# Patient Record
Sex: Male | Born: 1970 | Race: White | Hispanic: No | Smoking: Current every day smoker
Health system: Southern US, Community
[De-identification: ages and names within clinical notes are randomized; demographics above are authoritative.]

## PROBLEM LIST (undated history)

## (undated) DIAGNOSIS — F209 Schizophrenia, unspecified: Secondary | ICD-10-CM

## (undated) DIAGNOSIS — F329 Major depressive disorder, single episode, unspecified: Secondary | ICD-10-CM

## (undated) DIAGNOSIS — L509 Urticaria, unspecified: Secondary | ICD-10-CM

## (undated) DIAGNOSIS — F319 Bipolar disorder, unspecified: Secondary | ICD-10-CM

## (undated) DIAGNOSIS — F419 Anxiety disorder, unspecified: Secondary | ICD-10-CM

## (undated) DIAGNOSIS — F32A Depression, unspecified: Secondary | ICD-10-CM

## (undated) DIAGNOSIS — K219 Gastro-esophageal reflux disease without esophagitis: Secondary | ICD-10-CM

## (undated) DIAGNOSIS — G43909 Migraine, unspecified, not intractable, without status migrainosus: Secondary | ICD-10-CM

## (undated) DIAGNOSIS — R011 Cardiac murmur, unspecified: Secondary | ICD-10-CM

---

## 1999-04-21 ENCOUNTER — Emergency Department (HOSPITAL_COMMUNITY): Admission: EM | Admit: 1999-04-21 | Discharge: 1999-04-21 | Payer: Self-pay | Admitting: Emergency Medicine

## 2000-03-16 ENCOUNTER — Encounter: Payer: Self-pay | Admitting: Emergency Medicine

## 2000-03-16 ENCOUNTER — Emergency Department (HOSPITAL_COMMUNITY): Admission: EM | Admit: 2000-03-16 | Discharge: 2000-03-16 | Payer: Self-pay | Admitting: Emergency Medicine

## 2001-08-17 ENCOUNTER — Emergency Department (HOSPITAL_COMMUNITY): Admission: EM | Admit: 2001-08-17 | Discharge: 2001-08-17 | Payer: Self-pay | Admitting: Emergency Medicine

## 2002-08-07 ENCOUNTER — Emergency Department (HOSPITAL_COMMUNITY): Admission: EM | Admit: 2002-08-07 | Discharge: 2002-08-07 | Payer: Self-pay | Admitting: Emergency Medicine

## 2002-08-07 ENCOUNTER — Encounter: Payer: Self-pay | Admitting: Emergency Medicine

## 2003-08-06 ENCOUNTER — Ambulatory Visit (HOSPITAL_COMMUNITY): Admission: RE | Admit: 2003-08-06 | Discharge: 2003-08-06 | Payer: Self-pay | Admitting: Orthopedic Surgery

## 2003-08-06 ENCOUNTER — Ambulatory Visit (HOSPITAL_BASED_OUTPATIENT_CLINIC_OR_DEPARTMENT_OTHER): Admission: RE | Admit: 2003-08-06 | Discharge: 2003-08-06 | Payer: Self-pay | Admitting: Orthopedic Surgery

## 2003-09-13 ENCOUNTER — Emergency Department (HOSPITAL_COMMUNITY): Admission: EM | Admit: 2003-09-13 | Discharge: 2003-09-13 | Payer: Self-pay | Admitting: Emergency Medicine

## 2004-12-02 ENCOUNTER — Emergency Department (HOSPITAL_COMMUNITY): Admission: EM | Admit: 2004-12-02 | Discharge: 2004-12-02 | Payer: Self-pay | Admitting: Emergency Medicine

## 2014-05-28 ENCOUNTER — Encounter (HOSPITAL_COMMUNITY): Payer: Self-pay | Admitting: Emergency Medicine

## 2014-05-28 ENCOUNTER — Emergency Department (HOSPITAL_COMMUNITY)
Admission: EM | Admit: 2014-05-28 | Discharge: 2014-05-28 | Disposition: A | Discharge status: Home / Self Care | Payer: Self-pay | Attending: Emergency Medicine | Admitting: Emergency Medicine

## 2014-05-28 ENCOUNTER — Emergency Department (HOSPITAL_COMMUNITY): Payer: Self-pay

## 2014-05-28 DIAGNOSIS — W010XXA Fall on same level from slipping, tripping and stumbling without subsequent striking against object, initial encounter: Secondary | ICD-10-CM | POA: Insufficient documentation

## 2014-05-28 DIAGNOSIS — R0781 Pleurodynia: Secondary | ICD-10-CM

## 2014-05-28 DIAGNOSIS — S46909A Unspecified injury of unspecified muscle, fascia and tendon at shoulder and upper arm level, unspecified arm, initial encounter: Secondary | ICD-10-CM | POA: Insufficient documentation

## 2014-05-28 DIAGNOSIS — S298XXA Other specified injuries of thorax, initial encounter: Secondary | ICD-10-CM | POA: Insufficient documentation

## 2014-05-28 DIAGNOSIS — Y9389 Activity, other specified: Secondary | ICD-10-CM | POA: Insufficient documentation

## 2014-05-28 DIAGNOSIS — S4980XA Other specified injuries of shoulder and upper arm, unspecified arm, initial encounter: Secondary | ICD-10-CM | POA: Insufficient documentation

## 2014-05-28 DIAGNOSIS — Y92009 Unspecified place in unspecified non-institutional (private) residence as the place of occurrence of the external cause: Secondary | ICD-10-CM | POA: Insufficient documentation

## 2014-05-28 DIAGNOSIS — M25511 Pain in right shoulder: Secondary | ICD-10-CM

## 2014-05-28 DIAGNOSIS — F17200 Nicotine dependence, unspecified, uncomplicated: Secondary | ICD-10-CM | POA: Insufficient documentation

## 2014-05-28 MED ORDER — IBUPROFEN 800 MG PO TABS
800.0000 mg | ORAL_TABLET | Freq: Once | ORAL | Status: AC
  Administered 2014-05-28: 800 mg via ORAL
  Filled 2014-05-28: qty 1

## 2014-05-28 MED ORDER — HYDROCODONE-ACETAMINOPHEN 5-325 MG PO TABS: 1.0000 | ORAL_TABLET | ORAL | Status: DC | PRN

## 2014-05-28 MED ORDER — IBUPROFEN 800 MG PO TABS: 800.0000 mg | ORAL_TABLET | Freq: Three times a day (TID) | ORAL | Status: DC | PRN

## 2014-05-28 NOTE — ED Provider Notes (Signed)
TIME SEEN: 4:55 PM  CHIEF COMPLAINT:  Right rib pain and shoulder pain  HPI: Patient is a 43 year old right-hand-dominant male with no significant past medical history who reports she tripped on uneven floor in his house last night when getting up to the bathroom and fell into a [place] cabinet. He states that while at work today he noticed that his right shoulder and right rib cage was bothering him. He did not hit his head or pass out. Denies any numbness, tingling or focal weakness. No shortness of breath. No abdominal pain. He is not on anticoagulation. States he drove himself to the emergency department.  ROS: See HPI Constitutional: no fever  Eyes: no drainage  ENT: no runny nose   Cardiovascular:  no chest pain  Resp: no SOB  GI: no vomiting GU: no dysuria Integumentary: no rash  Allergy: no hives  Musculoskeletal: no leg swelling  Neurological: no slurred speech ROS otherwise negative  PAST MEDICAL HISTORY/PAST SURGICAL HISTORY:  History reviewed. No pertinent past medical history.  MEDICATIONS:  Prior to Admission medications   Not on File    ALLERGIES:  Allergies  Allergen Reactions  . Bee Venom     SOCIAL HISTORY:  History  Substance Use Topics  . Smoking status: Light Tobacco Smoker  . Smokeless tobacco: Not on file  . Alcohol Use: No    FAMILY HISTORY: History reviewed. No pertinent family history.  EXAM: BP 119/73  Pulse 77  Temp(Src) 99.3 F (37.4 C) (Oral)  Resp 18  Ht 5\' 9"  (1.753 m)  Wt 156 lb (70.761 kg)  BMI 23.03 kg/m2  SpO2 99% CONSTITUTIONAL: Alert and oriented and responds appropriately to questions. Well-appearing; well-nourished; GCS 15 HEAD: Normocephalic; atraumatic EYES: Conjunctivae clear, PERRL, EOMI ENT: normal nose; no rhinorrhea; moist mucous membranes; pharynx without lesions noted; no dental injury; no septal hematoma NECK: Supple, no meningismus, no LAD; no midline spinal tenderness, step-off or deformity CARD: RRR; S1 and  S2 appreciated; no murmurs, no clicks, no rubs, no gallops RESP: Normal chest excursion without splinting or tachypnea; breath sounds clear and equal bilaterally; no wheezes, no rhonchi, no rales; chest wall stable, tender to palpation over the right ribs with no obvious bony deformity or crepitus or ecchymosis ABD/GI: Normal bowel sounds; non-distended; soft, non-tender, no rebound, no guarding PELVIS:  stable, nontender to palpation BACK:  The back appears normal and is non-tender to palpation, there is no CVA tenderness; no midline spinal tenderness, step-off or deformity EXT: Tender to palpation over the right shoulder but full range of motion in all joints of the right arm, 2+ radial pulses bilaterally, sensation to light touch intact diffusely, there is a small bruise to the distal ulnar/volar aspect of the right forearm with no bony tenderness or deformity, Normal ROM in all joints; otherwise extremities are non-tender to palpation; no edema; normal capillary refill; no cyanosis; compartments soft SKIN: Normal color for age and race; warm NEURO: Moves all extremities equally, sensation to light touch intact diffusely PSYCH: The patient's mood and manner are appropriate. Grooming and personal hygiene are appropriate.  MEDICAL DECISION MAKING: Patient here with mechanical fall. We'll obtain x-rays of his right shoulder and right ribs. Will give ibuprofen for pain. Anticipate discharge home.  ED PROGRESS: X-ray show no acute fracture. Likely just contusions. We'll discharge him with supportive care instructions and return precautions, prescription for Vicodin as needed and ibuprofen. Patient verbalizes understanding and is comfortable with plan.     Layla MawKristen N Kushi Kun, DO 05/28/14  1804 

## 2014-05-28 NOTE — Discharge Instructions (Signed)
RICE: Routine Care for Injuries  The routine care of many injuries includes Rest, Ice, Compression, and Elevation (RICE).  HOME CARE INSTRUCTIONS  · Rest is needed to allow your body to heal. Routine activities can usually be resumed when comfortable. Injured tendons and bones can take up to 6 weeks to heal. Tendons are the cord-like structures that attach muscle to bone.  · Ice following an injury helps keep the swelling down and reduces pain.  ¨ Put ice in a plastic bag.  ¨ Place a towel between your skin and the bag.  ¨ Leave the ice on for 15-20 minutes, 3-4 times a day, or as directed by your health care provider. Do this while awake, for the first 24 to 48 hours. After that, continue as directed by your caregiver.  · Compression helps keep swelling down. It also gives support and helps with discomfort. If an elastic bandage has been applied, it should be removed and reapplied every 3 to 4 hours. It should not be applied tightly, but firmly enough to keep swelling down. Watch fingers or toes for swelling, bluish discoloration, coldness, numbness, or excessive pain. If any of these problems occur, remove the bandage and reapply loosely. Contact your caregiver if these problems continue.  · Elevation helps reduce swelling and decreases pain. With extremities, such as the arms, hands, legs, and feet, the injured area should be placed near or above the level of the heart, if possible.  SEEK IMMEDIATE MEDICAL CARE IF:  · You have persistent pain and swelling.  · You develop redness, numbness, or unexpected weakness.  · Your symptoms are getting worse rather than improving after several days.  These symptoms may indicate that further evaluation or further X-rays are needed. Sometimes, X-rays may not show a small broken bone (fracture) until 1 week or 10 days later. Make a follow-up appointment with your caregiver. Ask when your X-ray results will be ready. Make sure you get your X-ray results.  Document Released:  02/12/2001 Document Revised: 11/05/2013 Document Reviewed: 04/01/2011  ExitCare® Patient Information ©2015 [employer]. This information is not intended to replace advice given to you by your health care provider. Make sure you discuss any questions you have with your health care provider.  Contusion  A contusion is a deep bruise. Contusions are the result of an injury that caused bleeding under the skin. The contusion may turn blue, purple, or yellow. Minor injuries will give you a painless contusion, but more severe contusions may stay painful and swollen for a few weeks.   CAUSES   A contusion is usually caused by a blow, trauma, or direct force to an area of the body.  SYMPTOMS   · Swelling and redness of the injured area.  · Bruising of the injured area.  · Tenderness and soreness of the injured area.  · Pain.  DIAGNOSIS   The diagnosis can be made by taking a history and physical exam. An X-ray, CT scan, or MRI may be needed to determine if there were any associated injuries, such as fractures.  TREATMENT   Specific treatment will depend on what area of the body was injured. In general, the best treatment for a contusion is resting, icing, elevating, and applying cold compresses to the injured area. Over-the-counter medicines may also be recommended for pain control. Ask your caregiver what the best treatment is for your contusion.  HOME CARE INSTRUCTIONS   · Put ice on the injured area.  · Put ice in   a plastic bag.  · Place a towel between your skin and the bag.  · Leave the ice on for 15-20 minutes, 3-4 times a day, or as directed by your health care provider.  · Only take over-the-counter or prescription medicines for pain, discomfort, or fever as directed by your caregiver. Your caregiver may recommend avoiding anti-inflammatory medicines (aspirin, ibuprofen, and naproxen) for 48 hours because these medicines may increase bruising.  · Rest the injured area.  · If possible, elevate the injured area to  reduce swelling.  SEEK IMMEDIATE MEDICAL CARE IF:   · You have increased bruising or swelling.  · You have pain that is getting worse.  · Your swelling or pain is not relieved with medicines.  MAKE SURE YOU:   · Understand these instructions.  · Will watch your condition.  · Will get help right away if you are not doing well or get worse.  Document Released: 08/10/2005 Document Revised: 11/05/2013 Document Reviewed: 09/05/2011  ExitCare® Patient Information ©2015 [employer]. This information is not intended to replace advice given to you by your health care provider. Make sure you discuss any questions you have with your health care provider.

## 2014-05-28 NOTE — ED Notes (Addendum)
Patient states he tripped due to uneven floor today. Right arm/rib pain. Worsen with movement.

## 2014-11-10 ENCOUNTER — Emergency Department (HOSPITAL_COMMUNITY): Payer: Medicaid Other

## 2014-11-10 ENCOUNTER — Emergency Department (HOSPITAL_COMMUNITY)
Admission: EM | Admit: 2014-11-10 | Discharge: 2014-11-10 | Disposition: A | Discharge status: Home / Self Care | Payer: Medicaid Other | Source: Home / Self Care | Attending: Family Medicine | Admitting: Family Medicine

## 2014-11-10 ENCOUNTER — Encounter (HOSPITAL_COMMUNITY): Payer: Self-pay | Admitting: Emergency Medicine

## 2014-11-10 ENCOUNTER — Encounter (HOSPITAL_COMMUNITY): Payer: Self-pay | Admitting: Adult Health

## 2014-11-10 ENCOUNTER — Emergency Department (HOSPITAL_COMMUNITY)
Admission: EM | Admit: 2014-11-10 | Discharge: 2014-11-11 | Disposition: A | Discharge status: Home / Self Care | Payer: Medicaid Other | Attending: Emergency Medicine | Admitting: Emergency Medicine

## 2014-11-10 DIAGNOSIS — R0602 Shortness of breath: Secondary | ICD-10-CM | POA: Diagnosis present

## 2014-11-10 DIAGNOSIS — Z720 Tobacco use: Secondary | ICD-10-CM | POA: Insufficient documentation

## 2014-11-10 DIAGNOSIS — J111 Influenza due to unidentified influenza virus with other respiratory manifestations: Secondary | ICD-10-CM | POA: Diagnosis not present

## 2014-11-10 DIAGNOSIS — R000 Tachycardia, unspecified: Secondary | ICD-10-CM | POA: Insufficient documentation

## 2014-11-10 DIAGNOSIS — J189 Pneumonia, unspecified organism: Secondary | ICD-10-CM

## 2014-11-10 LAB — BASIC METABOLIC PANEL
Anion gap: 14 (ref 5–15)
BUN: 12 mg/dL (ref 6–23)
CALCIUM: 9.7 mg/dL (ref 8.4–10.5)
CO2: 20 mmol/L (ref 19–32)
Chloride: 103 mEq/L (ref 96–112)
Creatinine, Ser: 0.88 mg/dL (ref 0.50–1.35)
GFR calc Af Amer: >90 mL/min (ref >90)
Glucose, Bld: 105 mg/dL — ABNORMAL HIGH (ref 70–99)
POTASSIUM: 3.6 mmol/L (ref 3.5–5.1)
SODIUM: 137 mmol/L (ref 135–145)

## 2014-11-10 LAB — CBC
HCT: 42.1 % (ref 39.0–52.0)
Hemoglobin: 14 g/dL (ref 13.0–17.0)
MCH: 31.1 pg (ref 26.0–34.0)
MCHC: 33.3 g/dL (ref 30.0–36.0)
MCV: 93.6 fL (ref 78.0–100.0)
PLATELETS: 216 10*3/uL (ref 150–400)
RBC: 4.5 MIL/uL (ref 4.22–5.81)
RDW: 13 % (ref 11.5–15.5)
WBC: 7.5 10*3/uL (ref 4.0–10.5)

## 2014-11-10 LAB — I-STAT CG4 LACTIC ACID, ED: LACTIC ACID, VENOUS: 2.28 mmol/L — AB (ref 0.5–2.2)

## 2014-11-10 MED ORDER — SODIUM CHLORIDE 0.9 % IV BOLUS (SEPSIS)
1000.0000 mL | Freq: Once | INTRAVENOUS | Status: AC
  Administered 2014-11-11: 1000 mL via INTRAVENOUS

## 2014-11-10 MED ORDER — ACETAMINOPHEN 325 MG PO TABS
650.0000 mg | ORAL_TABLET | Freq: Once | ORAL | Status: AC
  Administered 2014-11-11: 650 mg via ORAL
  Filled 2014-11-10: qty 2

## 2014-11-10 NOTE — ED Notes (Signed)
[facility] be discharged [facility] [place] [facility] [facility]

## 2014-11-10 NOTE — ED Provider Notes (Signed)
CSN: [id number]     Arrival date & time 11/10/14  1915 History   First MD Initiated Contact with Patient 11/10/14 1929     Chief Complaint  Patient presents with  . Cough  . Fever  . Generalized Body Aches   (Consider location/radiation/quality/duration/timing/severity/associated sxs/prior Treatment) Patient is a 43 y.o. male presenting with cough. The history is provided by the patient.  Cough Cough characteristics:  Productive Sputum characteristics:  Green Severity:  Moderate Onset quality:  Gradual Progression:  Worsening Chronicity:  New Smoker: yes   Context: upper respiratory infection   Relieved by:  None tried Worsened by:  Nothing tried Ineffective treatments:  None tried Associated symptoms: shortness of breath   Associated symptoms: no wheezing     History reviewed. No pertinent past medical history. History reviewed. No pertinent past surgical history. History reviewed. No pertinent family history. History  Substance Use Topics  . Smoking status: Current Every Day Smoker -- 0.50 packs/day    Types: Cigarettes  . Smokeless tobacco: Not on file  . Alcohol Use: No    Review of Systems  Constitutional: Negative.   HENT: Negative.   Respiratory: Positive for cough and shortness of breath. Negative for wheezing.   Cardiovascular: Negative.     Allergies  Bee venom  Home Medications   Prior to Admission medications   Medication Sig Start Date End Date Taking? Authorizing Provider  Aspirin-Caffeine (BC FAST PAIN RELIEF PO) Take 1 packet by mouth daily as needed. pain    Historical Provider, MD  HYDROcodone-acetaminophen (NORCO/VICODIN) 5-325 MG per tablet Take 1 tablet by mouth every 4 (four) hours as needed. 05/28/14   Kristen N Ward, DO  ibuprofen (ADVIL,MOTRIN) 800 MG tablet Take 1 tablet (800 mg total) by mouth every 8 (eight) hours as needed for mild pain. 05/28/14   Kristen N Ward, DO   BP 115/85 mmHg  Pulse 120  Temp(Src) 97.4 F (36.3 C) (Oral)   Resp 22  SpO2 95% Physical Exam  Constitutional: He is oriented to person, place, and time. He appears cachectic. He is cooperative. He has a sickly appearance. He appears ill.  HENT:  Right Ear: External ear normal.  Left Ear: External ear normal.  Mouth/Throat: Oropharynx is clear and moist.  Eyes: Conjunctivae are normal. Pupils are equal, round, and reactive to light.  Neck: Normal range of motion. Neck supple.  Cardiovascular: Normal rate, regular rhythm, normal heart sounds and intact distal pulses.   Pulmonary/Chest: He is in respiratory distress. He has decreased breath sounds. He has rhonchi. He has rales.  Neurological: He is alert and oriented to person, place, and time.  Skin: Skin is warm and dry.  Nursing note and vitals reviewed.   ED Course  Procedures (including critical care time) Labs Review Labs Reviewed - No data to display  Imaging Review No results found.   MDM   1. CAP (community acquired pneumonia)    Sent for eval of likely cap in smoker, ill appearing.    Linna HoffJames D Kindl, MD 11/10/14 [phone]

## 2014-11-10 NOTE — Discharge Instructions (Signed)
Go directly to ER for further eval. °

## 2014-11-10 NOTE — ED Notes (Signed)
Pt states that he has been congestion with cough and generalized body aches for 3 days.

## 2014-11-10 NOTE — ED Notes (Signed)
Presents with SOB, Congestion and not feeling well began Friday-[facility]. Reports productive cough. Pt is pale and has generalized fatigue.  Breath sounds are clear-endorses fever of 102.0

## 2014-11-10 NOTE — ED Provider Notes (Signed)
CSN: [id number]     Arrival date & time 11/10/14  2007 History   First MD Initiated Contact with Patient 11/10/14 2313    This chart was scribed for Gwyneth SproutWhitney Dachelle Molzahn, MD by Marica OtterNusrat Rahman, ED Scribe. This patient was seen in room A06C/A06C and the patient's care was started at 11:27 PM.  Chief Complaint  Patient presents with  . Shortness of Breath   The history is provided by the patient. No language interpreter was used.    PCP: No PCP Per Patient HPI Comments: Frank Vincent is a 43 y.o. male, with Hx of daily tobacco use (0.5 ppd), who presents to the Emergency Department complaining of congestion with associated SOB, body aches, cough, fever, rhinorrhea onset 3 days ago. Pt reports his wife had the same Sx and she was Dx with pneumonia. Pt denies emesis, diarrhea, receiving the flu shot this year, any chronic health conditions.   History reviewed. No pertinent past medical history. History reviewed. No pertinent past surgical history. History reviewed. No pertinent family history. History  Substance Use Topics  . Smoking status: Current Every Day Smoker -- 0.50 packs/day    Types: Cigarettes  . Smokeless tobacco: Not on file  . Alcohol Use: No    Review of Systems  A complete 10 system review of systems was obtained and all systems are negative except as noted in the HPI and PMH.    Allergies  Bee venom  Home Medications   Prior to Admission medications   Medication Sig Start Date End Date Taking? Authorizing Provider  Aspirin-Caffeine (BC FAST PAIN RELIEF PO) Take 1 packet by mouth daily as needed. pain   Yes Historical Provider, MD  EPINEPHrine 0.3 mg/0.3 mL IJ SOAJ injection Inject 0.3 mg into the muscle daily as needed (allergic reaction).   Yes Historical Provider, MD  ibuprofen (ADVIL,MOTRIN) 800 MG tablet Take 1 tablet (800 mg total) by mouth every 8 (eight) hours as needed for mild pain. 05/28/14  Yes Kristen N Ward, DO  HYDROcodone-acetaminophen (NORCO/VICODIN)  5-325 MG per tablet Take 1 tablet by mouth every 4 (four) hours as needed. Patient not taking: Reported on 11/10/2014 05/28/14   Layla MawKristen N Ward, DO   Triage Vitals: BP 116/88 mmHg  Pulse 112  Temp(Src) 99.1 F (37.3 C) (Oral)  Resp 28  Ht 5\' 9"  (1.753 m)  Wt 130 lb (58.968 kg)  BMI 19.19 kg/m2  SpO2 100% Physical Exam  Constitutional: He is oriented to person, place, and time. He appears well-developed and well-nourished. No distress.  HENT:  Head: Normocephalic and atraumatic.  Mouth/Throat: Mucous membranes are dry.  Eyes: Conjunctivae and EOM are normal.  Neck: Neck supple. No tracheal deviation present.  Cardiovascular: Tachycardia present.   Pulmonary/Chest: Effort normal. No respiratory distress. He has wheezes (scant wheezing bilaterally ).  Musculoskeletal: Normal range of motion.  Neurological: He is alert and oriented to person, place, and time.  Skin: Skin is warm and dry.  Hot to the touch  Psychiatric: He has a normal mood and affect. His behavior is normal.  Nursing note and vitals reviewed.   ED Course  Procedures (including critical care time) DIAGNOSTIC STUDIES: Oxygen Saturation is 100% on RA, nl by my interpretation.    COORDINATION OF CARE: 11:30 PM-Discussed treatment plan which includes imaging results, fluids, and labs  with pt at bedside and pt agreed to plan.   Labs Review Labs Reviewed  BASIC METABOLIC PANEL - Abnormal; Notable for the following:    Glucose,  Bld 105 (*)    All other components within normal limits  I-STAT CG4 LACTIC ACID, ED - Abnormal; Notable for the following:    Lactic Acid, Venous 2.28 (*)    All other components within normal limits  CBC    Imaging Review Dg Chest 2 View  11/10/2014   CLINICAL DATA:  Cough, congestion, generalized body aches and intermittent fever for 3 days.  EXAM: CHEST  2 VIEW  COMPARISON:  Chest radiograph May 28, 2014  FINDINGS: Cardiomediastinal silhouette is unremarkable. The lungs are clear  without pleural effusions or focal consolidations. Trachea projects midline and there is no pneumothorax. Soft tissue planes and included osseous structures are non-suspicious.  IMPRESSION: No acute cardiopulmonary process; stable chest radiograph from May 28, 2014.   Electronically Signed   By: Awilda Metroourtnay  Bloomer   On: 11/10/2014 21:23     EKG Interpretation None      MDM   Final diagnoses:  Influenza    Pt with symptoms consistent with influenza.  Normal exam here but is febrile.  No signs of breathing difficulty but scant wheezing on exam.  No signs of strep pharyngitis, otitis or abnormal abdominal findings.   CXR wnl.  CBC, BMP done in triage wnl.  Lactate mildly elevated at 2.28.  Pt states has not eaten in 3 days due to feeling poorly and myalgias.   Will give IVF here and antipyretic. Pt is not a candidate for tamiflu as he is 4 days into illness and has no medical conditions that make him high risk.    1:30 AM Pt feeling better after fluids.  HR improved to 70's.  Will continue antipyretica and rest and fluids and return for any further problems.   I personally performed the services described in this documentation, which was scribed in my presence.  The recorded information has been reviewed and considered.     Gwyneth SproutWhitney Jericho Cieslik, MD 11/11/14 [phone]

## 2014-11-27 ENCOUNTER — Encounter (HOSPITAL_COMMUNITY): Payer: Self-pay | Admitting: Emergency Medicine

## 2015-10-08 IMAGING — DX DG CHEST 2V
2 series · 2 of 2 positions shown · non-contrast
Comparison: Chest radiograph May 28, 2014

CLINICAL DATA: Cough, congestion, generalized body aches and
intermittent fever for 3 days.

EXAM:
CHEST  2 VIEW

[chest pa]
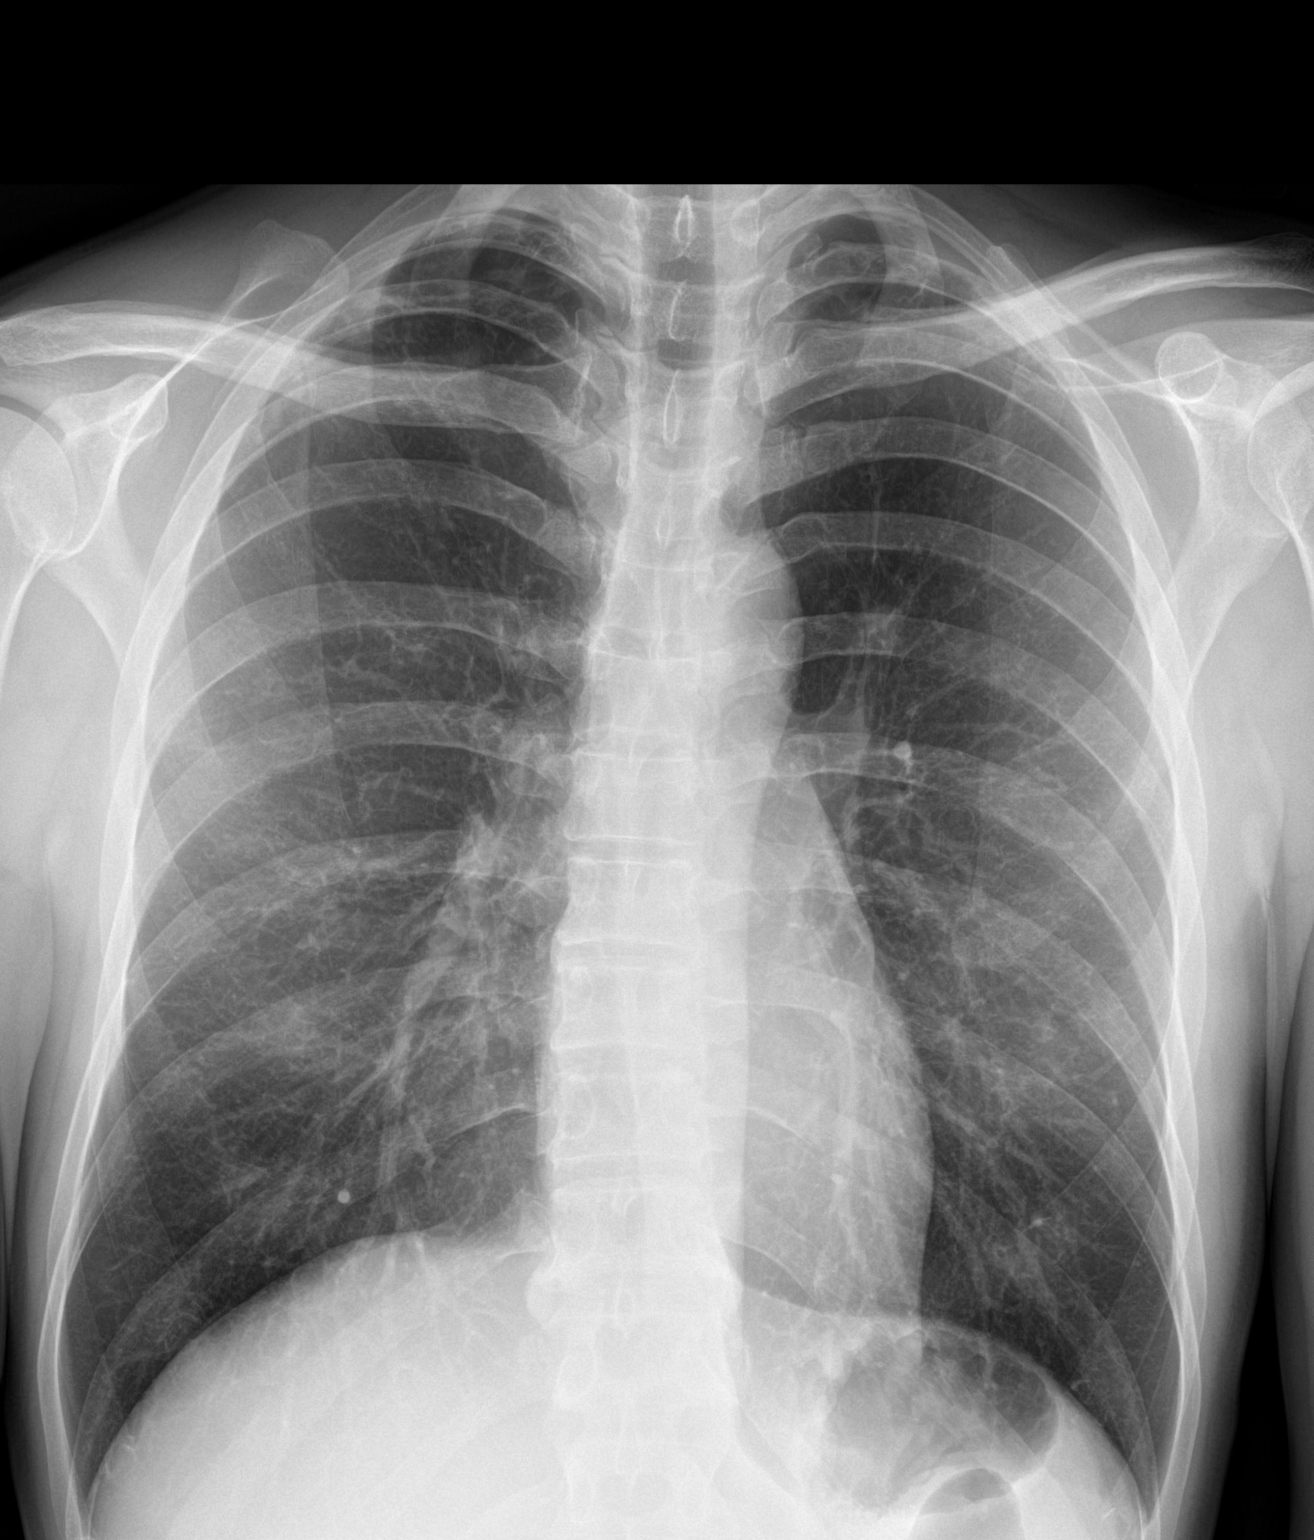

[chest lat]
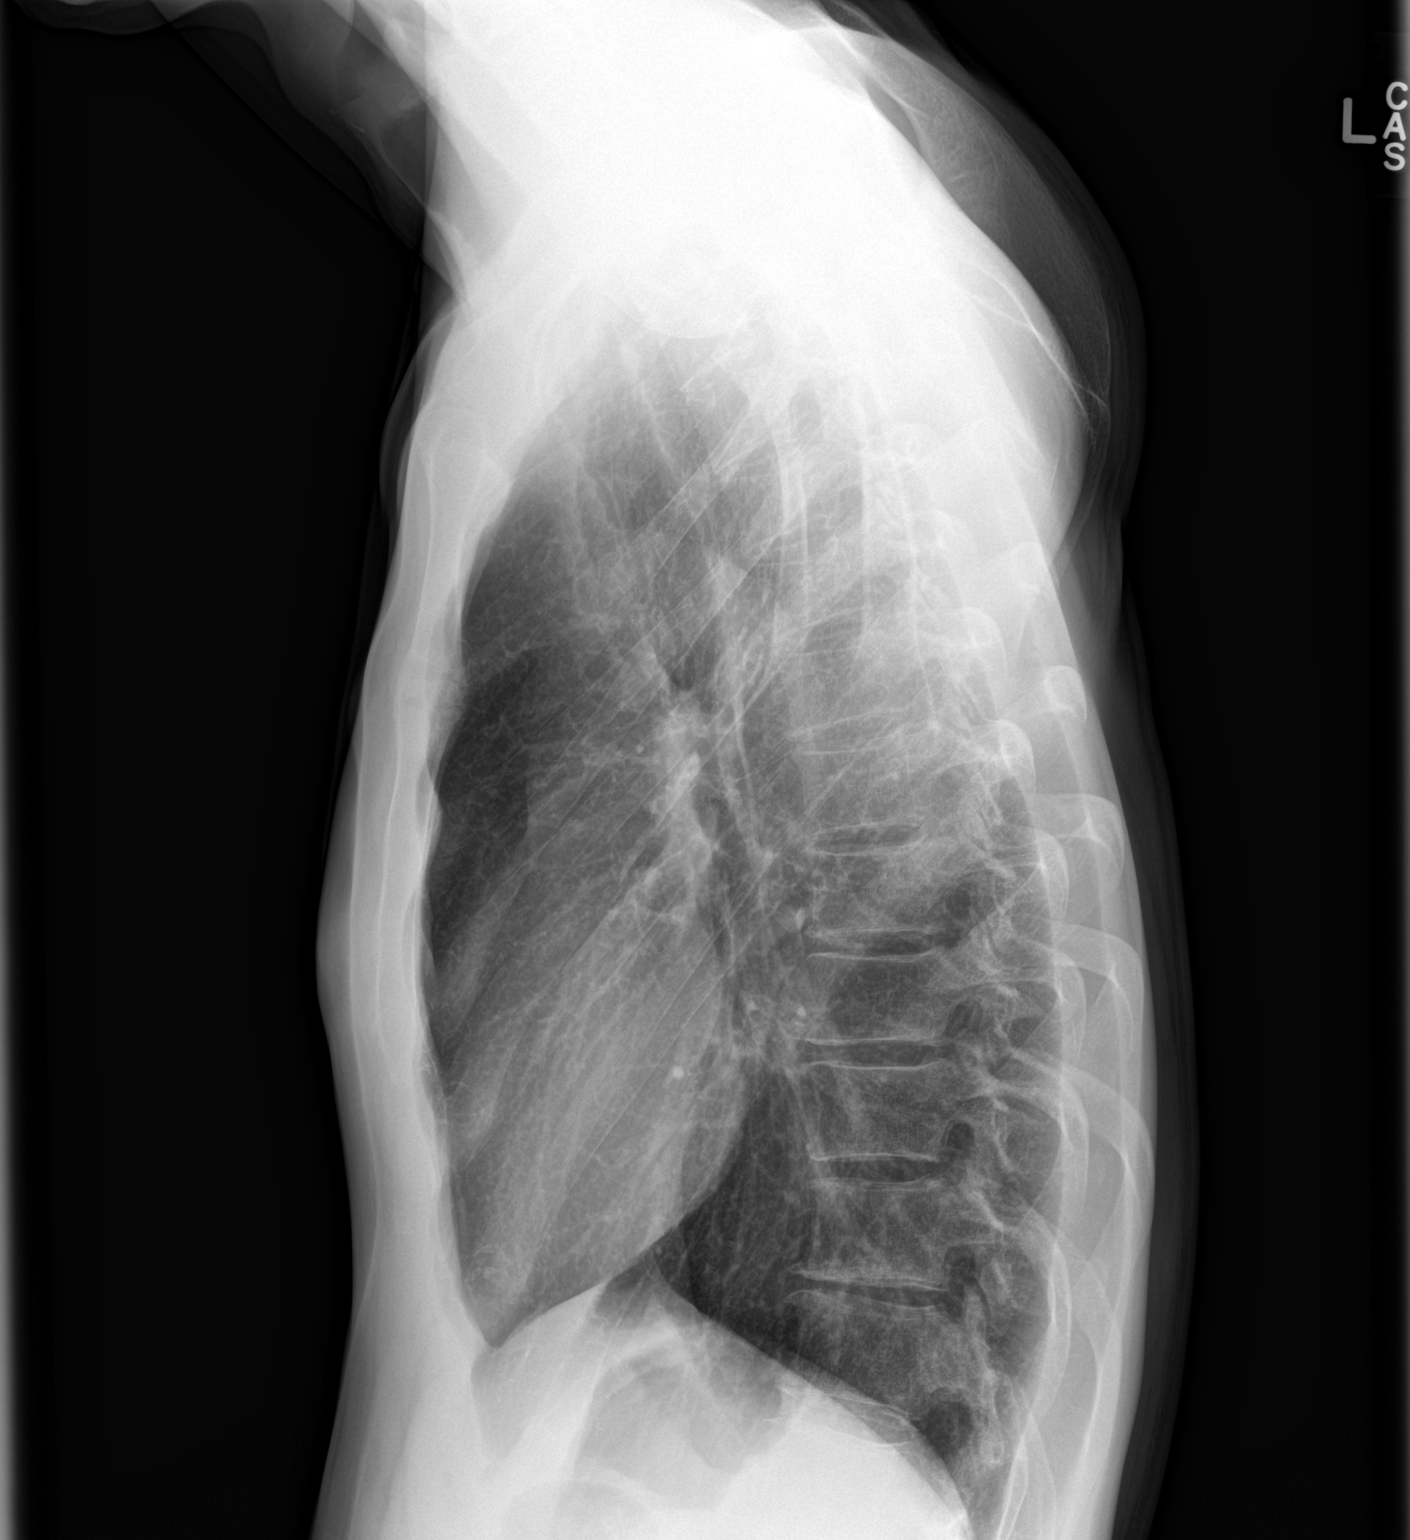

[2 of 2 positions shown; findings below may reference images not displayed]

FINDINGS: Cardiomediastinal silhouette is unremarkable. The lungs are clear
without pleural effusions or focal consolidations. Trachea projects
midline and there is no pneumothorax. Soft tissue planes and
included osseous structures are non-suspicious.
IMPRESSION: No acute cardiopulmonary process; stable chest radiograph from [place]

  By: Horten Nc

## 2016-01-15 ENCOUNTER — Emergency Department (HOSPITAL_COMMUNITY)
Admission: EM | Admit: 2016-01-15 | Discharge: 2016-01-15 | Disposition: A | Discharge status: Home / Self Care | Payer: Medicaid Other | Attending: Emergency Medicine | Admitting: Emergency Medicine

## 2016-01-15 ENCOUNTER — Encounter (HOSPITAL_COMMUNITY): Payer: Self-pay | Admitting: Emergency Medicine

## 2016-01-15 DIAGNOSIS — R21 Rash and other nonspecific skin eruption: Secondary | ICD-10-CM | POA: Diagnosis not present

## 2016-01-15 DIAGNOSIS — L299 Pruritus, unspecified: Secondary | ICD-10-CM | POA: Diagnosis not present

## 2016-01-15 DIAGNOSIS — B09 Unspecified viral infection characterized by skin and mucous membrane lesions: Secondary | ICD-10-CM

## 2016-01-15 DIAGNOSIS — F17210 Nicotine dependence, cigarettes, uncomplicated: Secondary | ICD-10-CM | POA: Diagnosis not present

## 2016-01-15 DIAGNOSIS — B9789 Other viral agents as the cause of diseases classified elsewhere: Secondary | ICD-10-CM | POA: Diagnosis not present

## 2016-01-15 HISTORY — DX: Urticaria, unspecified: L50.9

## 2016-01-15 MED ORDER — HYDROXYZINE PAMOATE 25 MG PO CAPS: 25.0000 mg | ORAL_CAPSULE | Freq: Four times a day (QID) | ORAL | Status: DC | PRN

## 2016-01-15 MED ORDER — DEXAMETHASONE 4 MG PO TABS: 4.0000 mg | ORAL_TABLET | Freq: Two times a day (BID) | ORAL | Status: DC

## 2016-01-15 NOTE — ED Provider Notes (Signed)
CSN: [id number]     Arrival date & time 01/15/16  1635 History   First MD Initiated Contact with Patient 01/15/16 1655     Chief Complaint  Patient presents with  . Rash     (Consider location/radiation/quality/duration/timing/severity/associated sxs/prior Treatment) Patient is a 45 y.o. male presenting with rash. The history is provided by the patient.  Rash Location:  Full body Quality: itchiness   Severity:  Moderate Onset quality:  Gradual Duration:  6 weeks Timing:  Intermittent Progression:  Worsening Chronicity:  New Relieved by:  Nothing Worsened by:  Nothing tried Ineffective treatments:  None tried Associated symptoms: no abdominal pain, no diarrhea, no fever, no headaches, no sore throat, no throat swelling, no tongue swelling and not vomiting     Past Medical History  Diagnosis Date  . Hives    History reviewed. No pertinent past surgical history. No family history on file. Social History  Substance Use Topics  . Smoking status: Current Every Day Smoker -- 1.00 packs/day    Types: Cigarettes  . Smokeless tobacco: None  . Alcohol Use: No    Review of Systems  Constitutional: Negative for fever.  HENT: Negative for sore throat.   Gastrointestinal: Negative for vomiting, abdominal pain and diarrhea.  Skin: Positive for rash.  Neurological: Negative for headaches.  All other systems reviewed and are negative.     Allergies  Bee venom  Home Medications   Prior to Admission medications   Medication Sig Start Date End Date Taking? Authorizing Provider  Aspirin-Caffeine (BC FAST PAIN RELIEF PO) Take 1 packet by mouth daily as needed. pain    Historical Provider, MD  HYDROcodone-acetaminophen (NORCO/VICODIN) 5-325 MG per tablet Take 1 tablet by mouth every 4 (four) hours as needed. Patient not taking: Reported on 11/10/2014 05/28/14   Kristen N Ward, DO  ibuprofen (ADVIL,MOTRIN) 800 MG tablet Take 1 tablet (800 mg total) by mouth every 8 (eight) hours as  needed for mild pain. 05/28/14   Kristen N Ward, DO   BP 120/67 mmHg  Pulse 78  Temp(Src) 98.2 F (36.8 C) (Oral)  Resp 20  Ht  (1.778 m)  Wt 64.411 kg  BMI 20.37 kg/m2  SpO2 99% Physical Exam  Constitutional: He is oriented to person, place, and time. He appears well-developed and well-nourished.  Non-toxic appearance.  HENT:  Head: Normocephalic.  Right Ear: Tympanic membrane and external ear normal.  Left Ear: Tympanic membrane and external ear normal.  Eyes: EOM and lids are normal. Pupils are equal, round, and reactive to light.  Neck: Normal range of motion. Neck supple. Carotid bruit is not present.  Cardiovascular: Normal rate, regular rhythm, normal heart sounds, intact distal pulses and normal pulses.   Pulmonary/Chest: Breath sounds normal. No respiratory distress.  Abdominal: Soft. Bowel sounds are normal. There is no tenderness. There is no guarding.  Musculoskeletal: Normal range of motion.  Lymphadenopathy:       Head (right side): No submandibular adenopathy present.       Head (left side): No submandibular adenopathy present.    He has no cervical adenopathy.  Neurological: He is alert and oriented to person, place, and time. He has normal strength. No cranial nerve deficit or sensory deficit.  Skin: Skin is warm and dry. Rash noted.  Papular rash of the chest, arms, back, abd. No blisters. No drainage. No red streaks. No rash in the oral pharynx, few lesions in the palms.  Psychiatric: He has a normal mood and affect.  His speech is normal.  Nursing note and vitals reviewed.   ED Course  Procedures (including critical care time) Labs Review Labs Reviewed - No data to display  Imaging Review No results found. I have personally reviewed and evaluated these images and lab results as part of my medical decision-making.   EKG Interpretation None      MDM Pt has flu 2-3 weeks ago, and uri recently. Suspect pt has a viral rash. Rx for decadron and  vistaril given to the patient. Pt to use allegra during the day for itching. Pt to see dermatology for recheck if not improving.   Final diagnoses:  None    **I have reviewed nursing notes, vital signs, and all appropriate lab and imaging results for this patient.Ivery Quale*    Yuya Vanwingerden, PA-C 01/15/16 1728  Vanetta MuldersScott Zackowski, MD 01/15/16 1840

## 2016-01-15 NOTE — ED Notes (Signed)
Pt with rash all over that started on hand and has spread since 1.5 months ago, denies fever or N/V, states daughter sleeps with him in same bed and denies daughter with any rash

## 2016-01-15 NOTE — ED Notes (Addendum)
Pt c/o generalized, raised, "itchy rash" x 1.5 months. Pt states it began on his hand, but has not spread all over body. Denies any new medications or detergents. Denies SOB.

## 2016-01-27 ENCOUNTER — Emergency Department (HOSPITAL_COMMUNITY)
Admission: EM | Admit: 2016-01-27 | Discharge: 2016-01-27 | Disposition: A | Discharge status: Home / Self Care | Payer: Medicaid Other | Source: Home / Self Care

## 2016-01-27 ENCOUNTER — Encounter (HOSPITAL_COMMUNITY): Payer: Self-pay

## 2016-01-27 DIAGNOSIS — R21 Rash and other nonspecific skin eruption: Secondary | ICD-10-CM

## 2016-01-27 MED ORDER — DEXAMETHASONE 4 MG PO TABS: 4.0000 mg | ORAL_TABLET | Freq: Two times a day (BID) | ORAL | Status: DC

## 2016-01-27 NOTE — ED Notes (Signed)
Patient presents with a rash symptoms started 40 days ago. Treatments tried has been ER visit and meds. No acute distress

## 2016-01-27 NOTE — Discharge Instructions (Signed)

## 2017-11-16 ENCOUNTER — Emergency Department (HOSPITAL_COMMUNITY)
Admission: EM | Admit: 2017-11-16 | Discharge: 2017-11-16 | Disposition: A | Discharge status: Home / Self Care | Payer: Medicaid Other | Attending: Emergency Medicine | Admitting: Emergency Medicine

## 2017-11-16 ENCOUNTER — Encounter (HOSPITAL_COMMUNITY): Payer: Self-pay | Admitting: Emergency Medicine

## 2017-11-16 DIAGNOSIS — M545 Low back pain, unspecified: Secondary | ICD-10-CM

## 2017-11-16 DIAGNOSIS — F17210 Nicotine dependence, cigarettes, uncomplicated: Secondary | ICD-10-CM | POA: Diagnosis not present

## 2017-11-16 DIAGNOSIS — Z79899 Other long term (current) drug therapy: Secondary | ICD-10-CM | POA: Diagnosis not present

## 2017-11-16 LAB — URINALYSIS, ROUTINE W REFLEX MICROSCOPIC
Bilirubin Urine: NEGATIVE
Glucose, UA: NEGATIVE mg/dL
Hgb urine dipstick: NEGATIVE
Ketones, ur: NEGATIVE mg/dL
Leukocytes, UA: NEGATIVE
NITRITE: NEGATIVE
Protein, ur: NEGATIVE mg/dL
SPECIFIC GRAVITY, URINE: 1.009 (ref 1.005–1.030)
pH: 7 (ref 5.0–8.0)

## 2017-11-16 LAB — BASIC METABOLIC PANEL
ANION GAP: 11 (ref 5–15)
BUN: 11 mg/dL (ref 6–20)
CHLORIDE: 102 mmol/L (ref 101–111)
CO2: 26 mmol/L (ref 22–32)
CREATININE: 0.75 mg/dL (ref 0.61–1.24)
Calcium: 9.9 mg/dL (ref 8.9–10.3)
GFR calc Af Amer: >60 mL/min (ref >60)
GFR calc non Af Amer: >60 mL/min (ref >60)
Glucose, Bld: 88 mg/dL (ref 65–99)
Potassium: 3.6 mmol/L (ref 3.5–5.1)
SODIUM: 139 mmol/L (ref 135–145)

## 2017-11-16 LAB — CBC
HCT: 43.4 % (ref 39.0–52.0)
Hemoglobin: 13.6 g/dL (ref 13.0–17.0)
MCH: 30.2 pg (ref 26.0–34.0)
MCHC: 31.3 g/dL (ref 30.0–36.0)
MCV: 96.4 fL (ref 78.0–100.0)
Platelets: 230 K/uL (ref 150–400)
RBC: 4.5 MIL/uL (ref 4.22–5.81)
RDW: 14.2 % (ref 11.5–15.5)
WBC: 7.2 K/uL (ref 4.0–10.5)

## 2017-11-16 MED ORDER — HYDROMORPHONE HCL 1 MG/ML IJ SOLN
1.0000 mg | Freq: Once | INTRAMUSCULAR | Status: AC
  Administered 2017-11-16: 1 mg via INTRAMUSCULAR
  Filled 2017-11-16: qty 1

## 2017-11-16 MED ORDER — IBUPROFEN 400 MG PO TABS: 400.0000 mg | ORAL_TABLET | Freq: Four times a day (QID) | ORAL | 0 refills | Status: DC | PRN

## 2017-11-16 MED ORDER — DIAZEPAM 5 MG PO TABS
5.0000 mg | ORAL_TABLET | Freq: Once | ORAL | Status: AC
Start: 2017-11-16 — End: 2017-11-16
  Administered 2017-11-16: 5 mg via ORAL
  Filled 2017-11-16: qty 1

## 2017-11-16 MED ORDER — ONDANSETRON HCL 4 MG PO TABS: 4.0000 mg | ORAL_TABLET | Freq: Three times a day (TID) | ORAL | 0 refills | Status: DC | PRN

## 2017-11-16 MED ORDER — ONDANSETRON 4 MG PO TBDP
4.0000 mg | ORAL_TABLET | Freq: Once | ORAL | Status: AC
  Administered 2017-11-16: 4 mg via ORAL
  Filled 2017-11-16: qty 1

## 2017-11-16 MED ORDER — DIAZEPAM 5 MG PO TABS: 5.0000 mg | ORAL_TABLET | Freq: Four times a day (QID) | ORAL | 0 refills | Status: DC | PRN

## 2017-11-16 MED ORDER — KETOROLAC TROMETHAMINE 60 MG/2ML IM SOLN
60.0000 mg | Freq: Once | INTRAMUSCULAR | Status: AC
  Administered 2017-11-16: 60 mg via INTRAMUSCULAR
  Filled 2017-11-16: qty 2

## 2017-11-16 NOTE — ED Provider Notes (Signed)
Emergency Department Provider Note   I have reviewed the triage vital signs and the nursing notes.   HISTORY  Chief Complaint Flank Pain   HPI Frank Vincent is a 47 y.o. male without significant past medical history the presents the emergency department today secondary to a couple days worth of back pain.  Patient states that he is his usual state of health when he woke up on January 1 and had bilateral lower back pain.  No midline pain.  He went to work that day and it worsened that night.  Yesterday it was still there but then throughout the day got more more severe and has been severe since that time.  He has tried to take nothing today for his symptoms he did try some ibuprofen that did not seem to help much.  He said heating pad did help at night.  No urinary symptoms.  Has developed some vomiting because of the pain.  No diarrhea or constipation.  No fevers.  No lower extremity weakness.  No trauma, history of cancer history of diabetes. No other associated or modifying symptoms.    Past Medical History:  Diagnosis Date  . Hives     There are no active problems to display for this patient.   History reviewed. No pertinent surgical history.  Current Outpatient Rx  . Order #: [id number] Class: Historical Med  . Order #: [id number] Class: Print  . Order #: [id number] Class: Normal  . Order #: [id number] Class: Print  . Order #: [id number] Class: Print  . Order #: [id number] Class: Print    Allergies Bee venom  History reviewed. No pertinent family history.  Social History Social History   Tobacco Use  . Smoking status: Current Every Day Smoker    Packs/day: 1.00    Types: Cigarettes  . Smokeless tobacco: Never Used  Substance Use Topics  . Alcohol use: Yes    Comment: occasionally  . Drug use: No    Review of Systems  All other systems negative except as documented in the HPI. All pertinent positives and negatives as reviewed in the  HPI. ____________________________________________   PHYSICAL EXAM:  VITAL SIGNS: ED Triage Vitals [11/16/17 1329]  Enc Vitals Group     BP 126/69     Pulse Rate 88     Resp 20     Temp 98.3 F (36.8 C)     Temp Source Oral     SpO2 99 %     Weight 155 lb (70.3 kg)     Height 5\' 10"  (1.778 m)     Head Circumference      Peak Flow      Pain Score 5     Pain Loc      Pain Edu?      Excl. in GC?     Constitutional: Alert and oriented. Well appearing and in no acute distress. Eyes: Conjunctivae are normal. PERRL. EOMI. Head: Atraumatic. Nose: No congestion/rhinnorhea. Mouth/Throat: Mucous membranes are moist.  Oropharynx non-erythematous. Neck: No stridor.  No meningeal signs.   Cardiovascular: Normal rate, regular rhythm. Good peripheral circulation. Grossly normal heart sounds.   Respiratory: Normal respiratory effort.  No retractions. Lungs CTAB. Gastrointestinal: Soft and nontender. No distention.  Musculoskeletal: No lower extremity tenderness nor edema. No gross deformities of extremities.  Pain over paraspinal muscles bilaterally of lumbar area of back.  No midline tenderness.  No rash Neurologic:  Normal speech and language. No gross focal neurologic deficits are appreciated.  Skin:  Skin is  warm, dry and intact. No rash noted.   ____________________________________________   LABS (all labs ordered are listed, but only abnormal results are displayed)  Labs Reviewed  URINALYSIS, ROUTINE W REFLEX MICROSCOPIC  BASIC METABOLIC PANEL  CBC   ____________________________________________   PROCEDURES  Procedure(s) performed:   Procedures   ____________________________________________   INITIAL IMPRESSION / ASSESSMENT AND PLAN / ED COURSE   Indication for imaging or further workup at this time.  No evidence of UTI, kidney stone or other kidney issues and history and exam is consistent with muscular pain. No red flags.  Will treat appropriately.   Pain  improved. Tolerating PO. Low suspicion for ongoing emergent cause for his symptoms. Stable for discharge.   Pertinent labs & imaging results that were available during my care of the patient were reviewed by me and considered in my medical decision making (see chart for details).  ____________________________________________  FINAL CLINICAL IMPRESSION(S) / ED DIAGNOSES  Final diagnoses:  None     MEDICATIONS GIVEN DURING THIS VISIT:  Medications  ketorolac (TORADOL) injection 60 mg (not administered)  HYDROmorphone (DILAUDID) injection 1 mg (not administered)  diazepam (VALIUM) tablet 5 mg (not administered)  ondansetron (ZOFRAN-ODT) disintegrating tablet 4 mg (not administered)     NEW OUTPATIENT MEDICATIONS STARTED DURING THIS VISIT:  This SmartLink is deprecated. Use AVSMEDLIST instead to display the medication list for a patient.  Note:  This note was prepared with assistance of Dragon voice recognition software. Occasional wrong-word or sound-a-like substitutions may have occurred due to the inherent limitations of voice recognition software.   Jennett Tarbell, Barbara CowerJason, MD 11/17/17 [phone]

## 2017-11-16 NOTE — ED Triage Notes (Signed)
Patient complaining of bilateral flank pain and vomiting x 4 days.

## 2018-05-23 ENCOUNTER — Emergency Department (HOSPITAL_COMMUNITY): Payer: Self-pay

## 2018-05-23 ENCOUNTER — Emergency Department (HOSPITAL_COMMUNITY)
Admission: EM | Admit: 2018-05-23 | Discharge: 2018-05-24 | Disposition: A | Discharge status: Other Acute Inpatient Hospital | Payer: Self-pay | Attending: Emergency Medicine | Admitting: Emergency Medicine

## 2018-05-23 ENCOUNTER — Other Ambulatory Visit: Payer: Self-pay

## 2018-05-23 ENCOUNTER — Encounter (HOSPITAL_COMMUNITY): Payer: Self-pay | Admitting: *Deleted

## 2018-05-23 DIAGNOSIS — F32A Depression, unspecified: Secondary | ICD-10-CM

## 2018-05-23 DIAGNOSIS — S92414A Nondisplaced fracture of proximal phalanx of right great toe, initial encounter for closed fracture: Secondary | ICD-10-CM

## 2018-05-23 DIAGNOSIS — Y9389 Activity, other specified: Secondary | ICD-10-CM | POA: Insufficient documentation

## 2018-05-23 DIAGNOSIS — F332 Major depressive disorder, recurrent severe without psychotic features: Secondary | ICD-10-CM | POA: Insufficient documentation

## 2018-05-23 DIAGNOSIS — R945 Abnormal results of liver function studies: Secondary | ICD-10-CM | POA: Insufficient documentation

## 2018-05-23 DIAGNOSIS — Y998 Other external cause status: Secondary | ICD-10-CM | POA: Insufficient documentation

## 2018-05-23 DIAGNOSIS — F17210 Nicotine dependence, cigarettes, uncomplicated: Secondary | ICD-10-CM | POA: Insufficient documentation

## 2018-05-23 DIAGNOSIS — F1010 Alcohol abuse, uncomplicated: Secondary | ICD-10-CM

## 2018-05-23 DIAGNOSIS — F329 Major depressive disorder, single episode, unspecified: Secondary | ICD-10-CM

## 2018-05-23 DIAGNOSIS — R45851 Suicidal ideations: Secondary | ICD-10-CM

## 2018-05-23 DIAGNOSIS — R7989 Other specified abnormal findings of blood chemistry: Secondary | ICD-10-CM

## 2018-05-23 DIAGNOSIS — Y929 Unspecified place or not applicable: Secondary | ICD-10-CM | POA: Insufficient documentation

## 2018-05-23 DIAGNOSIS — Z79899 Other long term (current) drug therapy: Secondary | ICD-10-CM | POA: Insufficient documentation

## 2018-05-23 DIAGNOSIS — W228XXA Striking against or struck by other objects, initial encounter: Secondary | ICD-10-CM | POA: Insufficient documentation

## 2018-05-23 HISTORY — DX: Gastro-esophageal reflux disease without esophagitis: K21.9

## 2018-05-23 HISTORY — DX: Depression, unspecified: F32.A

## 2018-05-23 HISTORY — DX: Migraine, unspecified, not intractable, without status migrainosus: G43.909

## 2018-05-23 HISTORY — DX: Major depressive disorder, single episode, unspecified: F32.9

## 2018-05-23 LAB — COMPREHENSIVE METABOLIC PANEL
ALBUMIN: 3.9 g/dL (ref 3.5–5.0)
ALT: 53 U/L — ABNORMAL HIGH (ref 0–44)
AST: 242 U/L — ABNORMAL HIGH (ref 15–41)
Alkaline Phosphatase: 53 U/L (ref 38–126)
Anion gap: 7 (ref 5–15)
BILIRUBIN TOTAL: 0.7 mg/dL (ref 0.3–1.2)
BUN: 7 mg/dL (ref 6–20)
CO2: 29 mmol/L (ref 22–32)
Calcium: 8.6 mg/dL — ABNORMAL LOW (ref 8.9–10.3)
Chloride: 106 mmol/L (ref 98–111)
Creatinine, Ser: 0.79 mg/dL (ref 0.61–1.24)
GFR calc Af Amer: >60 mL/min (ref >60)
GFR calc non Af Amer: >60 mL/min (ref >60)
GLUCOSE: 84 mg/dL (ref 70–99)
POTASSIUM: 3.2 mmol/L — AB (ref 3.5–5.1)
Sodium: 142 mmol/L (ref 135–145)
TOTAL PROTEIN: 6.7 g/dL (ref 6.5–8.1)

## 2018-05-23 LAB — CBC
HCT: 39.4 % (ref 39.0–52.0)
Hemoglobin: 13.2 g/dL (ref 13.0–17.0)
MCH: 31.4 pg (ref 26.0–34.0)
MCHC: 33.5 g/dL (ref 30.0–36.0)
MCV: 93.8 fL (ref 78.0–100.0)
Platelets: 229 10*3/uL (ref 150–400)
RBC: 4.2 MIL/uL — ABNORMAL LOW (ref 4.22–5.81)
RDW: 13 % (ref 11.5–15.5)
WBC: 8.1 10*3/uL (ref 4.0–10.5)

## 2018-05-23 LAB — SALICYLATE LEVEL: Salicylate Lvl: <7 mg/dL (ref 2.8–30.0)

## 2018-05-23 LAB — ACETAMINOPHEN LEVEL: Acetaminophen (Tylenol), Serum: <10 ug/mL — ABNORMAL LOW (ref 10–30)

## 2018-05-23 LAB — RAPID URINE DRUG SCREEN, HOSP PERFORMED
Amphetamines: NOT DETECTED
Benzodiazepines: NOT DETECTED
Cocaine: NOT DETECTED
OPIATES: NOT DETECTED
Tetrahydrocannabinol: NOT DETECTED

## 2018-05-23 LAB — ETHANOL: Alcohol, Ethyl (B): 203 mg/dL — ABNORMAL HIGH (ref <10)

## 2018-05-23 MED ORDER — THIAMINE HCL 100 MG/ML IJ SOLN: 100.0000 mg | Freq: Every day | INTRAMUSCULAR | Status: DC

## 2018-05-23 MED ORDER — LORAZEPAM 2 MG/ML IJ SOLN: 0.0000 mg | Freq: Four times a day (QID) | INTRAMUSCULAR | Status: DC

## 2018-05-23 MED ORDER — LORAZEPAM 1 MG PO TABS
0.0000 mg | ORAL_TABLET | Freq: Four times a day (QID) | ORAL | Status: DC
  Administered 2018-05-23: 2 mg via ORAL
  Administered 2018-05-24 (×2): 1 mg via ORAL
  Filled 2018-05-23: qty 1
  Filled 2018-05-23: qty 2
  Filled 2018-05-23: qty 1

## 2018-05-23 MED ORDER — NICOTINE 21 MG/24HR TD PT24
21.0000 mg | MEDICATED_PATCH | Freq: Every day | TRANSDERMAL | Status: DC | PRN
  Administered 2018-05-23: 21 mg via TRANSDERMAL
  Filled 2018-05-23: qty 1

## 2018-05-23 MED ORDER — LORAZEPAM 1 MG PO TABS: 0.0000 mg | ORAL_TABLET | Freq: Two times a day (BID) | ORAL | Status: DC

## 2018-05-23 MED ORDER — POTASSIUM CHLORIDE CRYS ER 20 MEQ PO TBCR
40.0000 meq | EXTENDED_RELEASE_TABLET | Freq: Once | ORAL | Status: AC
Start: 2018-05-23 — End: 2018-05-23
  Administered 2018-05-23: 40 meq via ORAL
  Filled 2018-05-23: qty 2

## 2018-05-23 MED ORDER — IBUPROFEN 400 MG PO TABS: 400.0000 mg | ORAL_TABLET | ORAL | Status: DC | PRN

## 2018-05-23 MED ORDER — VITAMIN B-1 100 MG PO TABS
100.0000 mg | ORAL_TABLET | Freq: Every day | ORAL | Status: DC
  Administered 2018-05-24: 100 mg via ORAL
  Filled 2018-05-23: qty 1

## 2018-05-23 MED ORDER — ACETAMINOPHEN 325 MG PO TABS: 650.0000 mg | ORAL_TABLET | ORAL | Status: DC | PRN

## 2018-05-23 MED ORDER — LORAZEPAM 2 MG/ML IJ SOLN: 0.0000 mg | Freq: Two times a day (BID) | INTRAMUSCULAR | Status: DC
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
## 2018-05-23 NOTE — ED Triage Notes (Signed)
Pt arrived to er with Si, states "If I had known that the officer was coming I would not be here right now" , pt states that he has hx of depression and it has gotten worse over the past two months, plan of SI is to shoot himself,
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
## 2018-05-24 ENCOUNTER — Inpatient Hospital Stay (HOSPITAL_COMMUNITY)
Admission: AD | Admit: 2018-05-24 | Discharge: 2018-05-28 | DRG: 885 | Disposition: A | Discharge status: Home / Self Care | Payer: Federal, State, Local not specified - Other | Source: Intra-hospital | Attending: Psychiatry | Admitting: Psychiatry

## 2018-05-24 ENCOUNTER — Other Ambulatory Visit: Payer: Self-pay

## 2018-05-24 ENCOUNTER — Encounter (HOSPITAL_COMMUNITY): Payer: Self-pay | Admitting: *Deleted

## 2018-05-24 DIAGNOSIS — Z6372 Alcoholism and drug addiction in family: Secondary | ICD-10-CM

## 2018-05-24 DIAGNOSIS — F332 Major depressive disorder, recurrent severe without psychotic features: Secondary | ICD-10-CM | POA: Diagnosis present

## 2018-05-24 DIAGNOSIS — K219 Gastro-esophageal reflux disease without esophagitis: Secondary | ICD-10-CM | POA: Diagnosis present

## 2018-05-24 DIAGNOSIS — Z91030 Bee allergy status: Secondary | ICD-10-CM

## 2018-05-24 DIAGNOSIS — F1014 Alcohol abuse with alcohol-induced mood disorder: Secondary | ICD-10-CM | POA: Diagnosis not present

## 2018-05-24 DIAGNOSIS — F10239 Alcohol dependence with withdrawal, unspecified: Secondary | ICD-10-CM | POA: Diagnosis present

## 2018-05-24 DIAGNOSIS — F419 Anxiety disorder, unspecified: Secondary | ICD-10-CM | POA: Diagnosis present

## 2018-05-24 DIAGNOSIS — F17210 Nicotine dependence, cigarettes, uncomplicated: Secondary | ICD-10-CM | POA: Diagnosis present

## 2018-05-24 DIAGNOSIS — G4700 Insomnia, unspecified: Secondary | ICD-10-CM | POA: Diagnosis present

## 2018-05-24 DIAGNOSIS — R45851 Suicidal ideations: Secondary | ICD-10-CM | POA: Diagnosis present

## 2018-05-24 MED ORDER — PANTOPRAZOLE SODIUM 40 MG PO TBEC
40.0000 mg | DELAYED_RELEASE_TABLET | Freq: Once | ORAL | Status: AC
  Administered 2018-05-24: 40 mg via ORAL
  Filled 2018-05-24: qty 1

## 2018-05-24 MED ORDER — FAMOTIDINE 20 MG PO TABS
20.0000 mg | ORAL_TABLET | Freq: Every day | ORAL | Status: DC
  Administered 2018-05-25 – 2018-05-28 (×4): 20 mg via ORAL
  Filled 2018-05-24: qty 1
  Filled 2018-05-24: qty 7
  Filled 2018-05-24 (×4): qty 1

## 2018-05-24 MED ORDER — IBUPROFEN 600 MG PO TABS
600.0000 mg | ORAL_TABLET | Freq: Four times a day (QID) | ORAL | Status: DC | PRN
  Administered 2018-05-24 – 2018-05-26 (×4): 600 mg via ORAL
  Filled 2018-05-24 (×4): qty 1

## 2018-05-24 MED ORDER — GI COCKTAIL ~~LOC~~
30.0000 mL | Freq: Once | ORAL | Status: AC
  Administered 2018-05-24: 30 mL via ORAL
  Filled 2018-05-24: qty 30

## 2018-05-24 MED ORDER — NICOTINE 21 MG/24HR TD PT24
21.0000 mg | MEDICATED_PATCH | Freq: Every day | TRANSDERMAL | Status: DC
  Administered 2018-05-25 – 2018-05-28 (×4): 21 mg via TRANSDERMAL
  Filled 2018-05-24 (×7): qty 1

## 2018-05-24 MED ORDER — TRAZODONE HCL 50 MG PO TABS
50.0000 mg | ORAL_TABLET | Freq: Every evening | ORAL | Status: DC | PRN
  Administered 2018-05-24: 50 mg via ORAL
  Filled 2018-05-24 (×6): qty 1

## 2018-05-24 MED ORDER — HYDROXYZINE HCL 25 MG PO TABS
25.0000 mg | ORAL_TABLET | Freq: Three times a day (TID) | ORAL | Status: DC | PRN
  Administered 2018-05-24: 25 mg via ORAL
  Filled 2018-05-24: qty 1

## 2018-05-24 NOTE — ED Notes (Signed)
PT transported with [place] [employer] at this time [facility].

## 2018-05-24 NOTE — ED Notes (Signed)
Report given to Casimiro NeedleMichael, [facility].
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
## 2018-05-24 NOTE — ED Notes (Signed)
Patient is resting at this time, with sitter at bedside.

## 2018-05-24 NOTE — ED Notes (Signed)
Pt asked to keep his tray because he didn't eat much & he said he may eat it later

## 2018-05-24 NOTE — ED Notes (Signed)
Pt asked if he wanted a shower, pt responded that he would take one later

## 2018-05-24 NOTE — Tx Team (Signed)
Initial Treatment Plan 05/24/2018 6:09 PM Frank Direaryl L Mcglinchey ZOX:[id number]    PATIENT STRESSORS: Financial difficulties Substance abuse   PATIENT STRENGTHS: Ability for insight Average or above average intelligence Capable of independent living Communication skills General fund of knowledge   PATIENT IDENTIFIED PROBLEMS: Depression Suicidal thoughts Alcohol abuse "Don't know, not sure what triggered me yesterday"                     DISCHARGE CRITERIA:  Ability to meet basic life and health needs Improved stabilization in mood, thinking, and/or behavior Verbal commitment to aftercare and medication compliance  PRELIMINARY DISCHARGE PLAN: Attend aftercare/continuing care group Return to previous living arrangement  PATIENT/FAMILY INVOLVEMENT: This treatment plan has been presented to and reviewed with the patient, Frank Vincent, and/or family member, .  The patient and family have been given the opportunity to ask questions and make suggestions.  Kaelea Gathright, [place], [place] 05/24/2018, 6:09 PM

## 2018-05-24 NOTE — ED Notes (Signed)
Pt given lunch tray.

## 2018-05-24 NOTE — ED Notes (Signed)
Pt doesn't want lunch tray. Lunch tray & spoon removed from room.

## 2018-05-24 NOTE — ED Notes (Signed)
Pt given breakfast tray

## 2018-05-24 NOTE — BH Assessment (Signed)
[facility] Assessment Progress Note  [facility] Attending provider is Dr. Jama Flavorsobos. Call report to [phone]. He must be IVC'd before he can be admitted. Please fax completed IVC paperwork to [phone]. PA, Raynelle FanningJulie, notified of acceptance with IVC condition by Miki KinsLinsey, AC. Raynelle FanningJulie will notify EDP [place].  Johny ShockSamantha M. Ladona Ridgelaylor, MS, NCC, LPCA Counselor

## 2018-05-25 DIAGNOSIS — F1014 Alcohol abuse with alcohol-induced mood disorder: Secondary | ICD-10-CM

## 2018-05-25 DIAGNOSIS — F332 Major depressive disorder, recurrent severe without psychotic features: Principal | ICD-10-CM

## 2018-05-25 MED ORDER — ADULT MULTIVITAMIN W/MINERALS CH
1.0000 | ORAL_TABLET | Freq: Every day | ORAL | Status: DC
  Administered 2018-05-25 – 2018-05-28 (×4): 1 via ORAL
  Filled 2018-05-25 (×7): qty 1

## 2018-05-25 MED ORDER — LORAZEPAM 1 MG PO TABS
1.0000 mg | ORAL_TABLET | Freq: Four times a day (QID) | ORAL | Status: DC | PRN
  Administered 2018-05-25: 1 mg via ORAL
  Filled 2018-05-25: qty 1

## 2018-05-25 MED ORDER — HYDROXYZINE HCL 25 MG PO TABS
25.0000 mg | ORAL_TABLET | Freq: Four times a day (QID) | ORAL | Status: DC | PRN
  Administered 2018-05-25 – 2018-05-27 (×4): 25 mg via ORAL
  Filled 2018-05-25 (×4): qty 1
  Filled 2018-05-25: qty 10

## 2018-05-25 MED ORDER — LOPERAMIDE HCL 2 MG PO CAPS: 2.0000 mg | ORAL_CAPSULE | ORAL | Status: DC | PRN

## 2018-05-25 MED ORDER — MIRTAZAPINE 15 MG PO TABS
15.0000 mg | ORAL_TABLET | Freq: Every day | ORAL | Status: DC
  Administered 2018-05-25 – 2018-05-27 (×3): 15 mg via ORAL
  Filled 2018-05-25: qty 7
  Filled 2018-05-25 (×5): qty 1

## 2018-05-25 MED ORDER — ONDANSETRON 4 MG PO TBDP: 4.0000 mg | ORAL_TABLET | Freq: Four times a day (QID) | ORAL | Status: DC | PRN

## 2018-05-25 MED ORDER — VITAMIN B-1 100 MG PO TABS
100.0000 mg | ORAL_TABLET | Freq: Every day | ORAL | Status: DC
  Administered 2018-05-26 – 2018-05-28 (×3): 100 mg via ORAL
  Filled 2018-05-25 (×5): qty 1

## 2018-05-25 NOTE — Plan of Care (Signed)
  Problem: Education: Goal: Knowledge of [facility] Education information/materials will improve Outcome: Progressing   Problem: Safety: Goal: Periods of time without injury will increase Outcome: Progressing   Problem: Medication: Goal: Compliance with prescribed medication regimen will improve Outcome: Progressing D: Pt awake in dayroom at this time interacting with peers. Presents animated in comparison to his flat affect and depressed mood at the beginning of this shift. Denies HI and AVH; endorsed passive SI without plan when assessed. Ambulatory with a slow but steady gait due to right foot injury (limbs). Rates his depression, anxiety and hopelessness all 10/10 on self inventory sheet. Reports poor appetite, poor sleep, low energy and poor concentration level. Attended scheduled groups and was engaged. Reports increased anxiety for withdrawal symptoms. A: Introduced self to pt. Emotional support and availability provided. Scheduled and PRN (Motrin & Vistaril) medications given with verbal education and effects monitored.  Updated pt on changes made to current treatment regimen. Coping skills and importance of groups attendance discussed with pt.  Safety checks maintained at Q 15 minutes intervals without outburst or self harm gestures.   R: Pt receptive to care. Compliant with medications when offered. Denies adverse drug reactions when assessed this shift. Tolerates all PO intake well. Pt in agreement with current treatment regimen. Verbalized understanding related to effective coping skills. POC maintained for safety and mood stability.
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
## 2018-05-25 NOTE — Tx Team (Addendum)
Interdisciplinary Treatment and Diagnostic Plan Update  05/25/2018 Time of Session: [place] MRN: [id number]  Principal Diagnosis: <principal problem not specified>  Secondary Diagnoses: Active Problems:   MDD (major depressive disorder), recurrent severe, without psychosis ([place])   Current Medications:  Current Facility-Administered Medications  Medication Dose Route Frequency Provider Last Rate Last Dose  . famotidine (PEPCID) tablet 20 mg  20 mg Oral Daily Lindon Romp A, NP   20 mg at 05/25/18 0833  . hydrOXYzine (ATARAX/VISTARIL) tablet 25 mg  25 mg Oral Q6H PRN Cobos, Myer Peer, MD      . ibuprofen (ADVIL,MOTRIN) tablet 600 mg  600 mg Oral Q6H PRN Rankin, Shuvon B, NP   600 mg at 05/25/18 0834  . loperamide (IMODIUM) capsule 2-4 mg  2-4 mg Oral PRN Cobos, Myer Peer, MD      . LORazepam (ATIVAN) tablet 1 mg  1 mg Oral Q6H PRN Cobos, Myer Peer, MD      . mirtazapine (REMERON) tablet 15 mg  15 mg Oral QHS Cobos, Myer Peer, MD      . multivitamin with minerals tablet 1 tablet  1 tablet Oral Daily Cobos, Fernando A, MD      . nicotine (NICODERM CQ - dosed in mg/24 hours) patch 21 mg  21 mg Transdermal Daily Cobos, Myer Peer, MD   21 mg at 05/25/18 0833  . ondansetron (ZOFRAN-ODT) disintegrating tablet 4 mg  4 mg Oral Q6H PRN Cobos, Myer Peer, MD      . Derrill Memo ON 05/26/2018] thiamine (VITAMIN B-1) tablet 100 mg  100 mg Oral Daily Cobos, Myer Peer, MD       PTA Medications: Medications Prior to Admission  Medication Sig Dispense Refill Last Dose  . ibuprofen (ADVIL,MOTRIN) 200 MG tablet Take 600 mg by mouth every 6 (six) hours as needed for mild pain or moderate pain.   05/23/2018 at morning    Patient Stressors: Financial difficulties Substance abuse  Patient Strengths: Ability for insight Average or above average intelligence Capable of independent living Curator fund of knowledge  Treatment Modalities: Medication Management, Group therapy,  Case management,  1 to 1 session with clinician, Psychoeducation, Recreational therapy.   Physician Treatment Plan for Primary Diagnosis: <principal problem not specified> Long Term Goal(s): Improvement in symptoms so as ready for discharge Improvement in symptoms so as ready for discharge   Short Term Goals: Ability to identify changes in lifestyle to reduce recurrence of condition will improve Ability to maintain clinical measurements within normal limits will improve Ability to identify triggers associated with substance abuse/mental health issues will improve  Medication Management: Evaluate patient's response, side effects, and tolerance of medication regimen.  Therapeutic Interventions: 1 to 1 sessions, Unit Group sessions and Medication administration.  Evaluation of Outcomes: Not Met  Physician Treatment Plan for Secondary Diagnosis: Active Problems:   MDD (major depressive disorder), recurrent severe, without psychosis ([place])  Long Term Goal(s): Improvement in symptoms so as ready for discharge Improvement in symptoms so as ready for discharge   Short Term Goals: Ability to identify changes in lifestyle to reduce recurrence of condition will improve Ability to maintain clinical measurements within normal limits will improve Ability to identify triggers associated with substance abuse/mental health issues will improve     Medication Management: Evaluate patient's response, side effects, and tolerance of medication regimen.  Therapeutic Interventions: 1 to 1 sessions, Unit Group sessions and Medication administration.  Evaluation of Outcomes: Not Met   RN Treatment Plan  for Primary Diagnosis: <principal problem not specified> Long Term Goal(s): Knowledge of disease and therapeutic regimen to maintain health will improve  Short Term Goals: Ability to identify and develop effective coping behaviors will improve and Compliance with prescribed medications will  improve  Medication Management: RN will administer medications as ordered by provider, will assess and evaluate patient's response and provide education to patient for prescribed medication. RN will report any adverse and/or side effects to prescribing provider.  Therapeutic Interventions: 1 on 1 counseling sessions, Psychoeducation, Medication administration, Evaluate responses to treatment, Monitor vital signs and CBGs as ordered, Perform/monitor CIWA, COWS, AIMS and Fall Risk screenings as ordered, Perform wound care treatments as ordered.  Evaluation of Outcomes: Not Met   LCSW Treatment Plan for Primary Diagnosis: <principal problem not specified> Long Term Goal(s): Safe transition to appropriate next level of care at discharge, Engage patient in therapeutic group addressing interpersonal concerns.  Short Term Goals: Engage patient in aftercare planning with referrals and resources, Increase social support and Increase skills for wellness and recovery  Therapeutic Interventions: Assess for all discharge needs, 1 to 1 time with Social worker, Explore available resources and support systems, Assess for adequacy in community support network, Educate family and significant other(s) on suicide prevention, Complete Psychosocial Assessment, Interpersonal group therapy.  Evaluation of Outcomes: Not Met   Progress in Treatment: Attending groups: No. Participating in groups: No. Taking medication as prescribed: Yes. Toleration medication: Yes. Family/Significant other contact made: No, will contact:  daughter Patient understands diagnosis: Yes. Discussing patient identified problems/goals with staff: Yes. Medical problems stabilized or resolved: Yes. Denies suicidal/homicidal ideation: Yes. Issues/concerns per patient self-inventory: No. Other: none  New problem(s) identified: No, Describe:  none  New Short Term/Long Term Goal(s):  Patient Goals:  "get my depression under  control."  Discharge Plan or Barriers:   Reason for Continuation of Hospitalization: Depression Medication stabilization  Estimated Length of Stay: 3-5 days.  Attendees: Patient: Frank Vincent 05/25/2018   Physician: Dr Parke Poisson, MD 05/25/2018   Nursing: Mayra Neer, RN 05/25/2018   RN Care Manager: 05/25/2018   Social Worker: Lurline Idol, LCSW 05/25/2018   Recreational Therapist:  05/25/2018   Other:  05/25/2018   Other:  05/25/2018   Other: 05/25/2018        Scribe for Treatment Team: Joanne Chars, LCSW 05/25/2018 4:12 PM

## 2018-05-25 NOTE — [facility] Group Notes (Signed)
[facility] LCSW Group Therapy Note  Date/Time: 05/25/18, 1315  Type of Therapy/Topic:  Group Therapy:  Feelings about Diagnosis  Participation Level:  Active   Mood:   Description of Group:    This group will allow patients to explore their thoughts and feelings about diagnoses they have received. Patients will be guided to explore their level of understanding and acceptance of these diagnoses. Facilitator will encourage patients to process their thoughts and feelings about the reactions of others to their diagnosis, and will guide patients in identifying ways to discuss their diagnosis with significant others in their lives. This group will be process-oriented, with patients participating in exploration of their own experiences as well as giving and receiving support and challenge from other group members.   Therapeutic Goals: 1. Patient will demonstrate understanding of diagnosis as evidence by identifying two or more symptoms of the disorder:  2. Patient will be able to express two feelings regarding the diagnosis 3. Patient will demonstrate ability to communicate their needs through discussion and/or role plays  Summary of Patient Progress:Pt was attentive throughout group and did make several contributions to the group discussion.        Therapeutic Modalities:   Cognitive Behavioral Therapy Brief Therapy Feelings Identification   Daleen SquibbGreg Myran Arcia, LCSW
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
## 2018-05-25 NOTE — Progress Notes (Signed)
The patient verbalized that he had a good day since he was able to talk to his peers. His goal for tomorrow is to have another good day like he experienced today.

## 2018-05-25 NOTE — [facility] Suicide Risk Assessment (Signed)
[facility]   Nursing information obtained from:  Patient Demographic factors:  Male, Caucasian, Low socioeconomic status, Living alone, Unemployed Current Mental Status:  Suicidal ideation indicated by patient, Self-harm thoughts Loss Factors:  Financial problems / change in socioeconomic status Historical Factors:  Family history of mental illness or substance abuse Risk Reduction Factors:  Living with another person, especially a relative, Positive coping skills or problem solving skills  Total Time spent with patient: 45 minutes Principal Problem:  MDD, Alcohol Use Disorder  Diagnosis:   Patient Active Problem List   Diagnosis Date Noted  . MDD (major depressive disorder), recurrent severe, without psychosis ([facility]) [F33.2] 05/24/2018   Subjective Data:   Continued Clinical Symptoms:  Alcohol Use Disorder Identification Test Final Score (AUDIT): 24 The "Alcohol Use Disorders Identification Test", Guidelines for Use in Primary Care, Second Edition.  World Science writerHealth Organization [facility](WHO). Score between 0-7:  no or low risk or alcohol related problems. Score between 8-15:  moderate risk of alcohol related problems. Score between 16-19:  high risk of alcohol related problems. Score 20 or above:  warrants further diagnostic evaluation for alcohol dependence and treatment.   CLINICAL FACTORS:   47 year old male, presents for worsening depression, suicidal ideations with thoughts of shooting himself, neurovegetative symptoms of depression, in the context of severe stressors, mainly financial.  He has been drinking in binges several times per week.  Psychiatric Specialty Exam: Physical Exam  ROS  Blood pressure 118/78, pulse 76, temperature 98.2 F (36.8 C), temperature source Oral, resp. rate 20, height 5\' 9"  (1.753 m), weight 56.2 kg (124 lb).Body mass index is 18.31 kg/m.  See admit note MSE   COGNITIVE FEATURES THAT CONTRIBUTE TO RISK:  Closed-mindedness  and Loss of executive function    SUICIDE RISK:   Moderate:  Frequent suicidal ideation with limited intensity, and duration, some specificity in terms of plans, no associated intent, good self-control, limited dysphoria/symptomatology, some risk factors present, and identifiable protective factors, including available and accessible social support.  PLAN OF CARE: Patient will be admitted to inpatient psychiatric unit for stabilization and safety. Will provide and encourage milieu participation. Provide medication management and maked adjustments as needed.  We will also provide medication management to address alcohol withdrawal if needed- will follow daily.    I certify that inpatient services furnished can reasonably be expected to improve the patient's condition.   Craige CottaFernando A Cobos, MD 05/25/2018, 3:22 PM

## 2018-05-25 NOTE — Progress Notes (Signed)
Recreation Therapy Notes  Date: 7.12.19 Time: 0930 Location: 300 Hall Dayroom  Group Topic: Stress Management  Goal Area(s) Addresses:  Patient will verbalize importance of using healthy stress management.  Patient will identify positive emotions associated with healthy stress management.   Behavioral Response: Engaged  Intervention: Stress Management  Activity :  Meditation.  LRT introduced the stress management technique of meditation.  LRT played Vincent meditation on being resilient.  Patients were to listen to the meditation and follow along as meditation played.  Education:  Stress Management, Discharge Planning.   Education Outcome: Acknowledges edcuation/In group clarification offered/Needs additional education  Clinical Observations/Feedback: Pt attended and participated in group.    Frank Vincent, LRT/CTRS         Frank Vincent 05/25/2018 11:35 AM 

## 2018-05-26 LAB — BASIC METABOLIC PANEL
ANION GAP: 6 (ref 5–15)
BUN: 13 mg/dL (ref 6–20)
CALCIUM: 9 mg/dL (ref 8.9–10.3)
CO2: 28 mmol/L (ref 22–32)
CREATININE: 0.8 mg/dL (ref 0.61–1.24)
Chloride: 110 mmol/L (ref 98–111)
GFR calc non Af Amer: >60 mL/min (ref >60)
Glucose, Bld: 89 mg/dL (ref 70–99)
Potassium: 3.8 mmol/L (ref 3.5–5.1)
Sodium: 144 mmol/L (ref 135–145)

## 2018-05-26 LAB — HEPATIC FUNCTION PANEL
ALBUMIN: 3.8 g/dL (ref 3.5–5.0)
ALT: 35 U/L (ref 0–44)
AST: 68 U/L — AB (ref 15–41)
Alkaline Phosphatase: 56 U/L (ref 38–126)
BILIRUBIN DIRECT: 0.1 mg/dL (ref 0.0–0.2)
BILIRUBIN TOTAL: 1 mg/dL (ref 0.3–1.2)
Indirect Bilirubin: 0.9 mg/dL (ref 0.3–0.9)
Total Protein: 6.9 g/dL (ref 6.5–8.1)

## 2018-05-26 LAB — TSH: TSH: 1.883 u[IU]/mL (ref 0.350–4.500)

## 2018-05-26 NOTE — Progress Notes (Signed)
D- Pt observed interacting with peers in day room.  Denies HI/AH/VH at this time.  Endorses passive Su w/o plan.  Rates depression 4/10 and anxiety 4/10.  Ambulates w/o difficulty.  Slow and steady gait.  "swelling is gone down and my foot feels a lot better"  Attends group.  Plans to d/c home with family for support.      A- Medications per MD order.  Support and encouragement provided.  1:1 spent time with patient.  R- Following treatment plan plan.  Q15 min checks  Contracts for safety.

## 2018-05-26 NOTE — B[facility] Counselor (Signed)
During SPE daughter [place] reported several thing she felt should be shared with assigned CSW and doctor. Daughter reports "I would like for someone to talk to him about how it's okay not to not be doing something grand with his life. I think he feels like because he is not doing something extravagant with his life that he is not reaching his full potential but he is about to be 50 and he its okay. I want him to understand he can still reach his full potential with us, his family." Daughter also verbalized that he was not on medication prior to the hospitalization and that she feels he needs some medication. Daughter also shared we are working on and setting up a plan for him so that he has better and more support when he returns home."  Shellia CleverlyStephanie N Thiago Ragsdale, KentuckyLCS[place]  05/26/2018 2:59 PM

## 2018-05-26 NOTE — [facility] Group Notes (Signed)
LCSW Group Therapy Note  05/26/2018    1:15-2:15pm  Type of Therapy and Topic:  Group Therapy: Anger and Coping Skills  Participation Level:  Active   Description of Group:   In this group, patients learned how to recognize the physical, cognitive, emotional, and behavioral responses they have to anger-provoking situations.  They identified how they usually or often react when angered, and learned how healthy and unhealthy coping skills work initially, but the unhealthy ones stop working.   They analyzed how their frequently-chosen coping skill is possibly beneficial and how it is possibly unhelpful.  The group discussed a variety of healthier coping skills that could help in resolving the actual issues, as well as how to go about planning for the the possibility of future similar situations.  Therapeutic Goals: 1. Patients will identify one thing that makes them angry and how they feel emotionally and physically, what their thoughts are or tend to be in those situations, and what healthy or unhealthy coping mechanism they typically use 2. Patients will identify how their coping technique works for them, as well as how it works against them. 3. Patients will explore possible new behaviors to use in future anger situations. 4. Patients will learn that anger itself is normal and cannot be eliminated, and that healthier coping skills can assist with resolving conflict rather than worsening situations.  Summary of Patient Progress:  The patient shared that he often gets loud and belligerent when angry.  When he shared being sarcastic with someone recently, and how that was a mild reaction for him, he nonetheless was able to see how a calm but assertive response would have been more helpful.  Therapeutic Modalities:   Cognitive Behavioral Therapy Motivation Interviewing  Lynnell ChadMareida J Grossman-Orr  .

## 2018-05-26 NOTE — Progress Notes (Signed)
[facility] MD Progress Note  05/26/2018 10:52 AM Frank Vincent  MRN:  [id number] Subjective: Patient reports feeling better than he did before admission, attributes this in part to feeling supported by family/adult daughter, and states he may go live with her following discharge.  Today denies suicidal ideations.  Denies medication side effects Objective:  I have reviewed chart notes and have met with patient. 47 year old male, presented to ED due to worsening depression, suicidal ideations, related partially to significant financial stressors/difficulties.  Has been drinking heavily(in binges) over recent weeks. Currently remains depressed but acknowledges feeling better than he did prior to admission and feels he has a good family support system as above. Denies suicidal ideations at this time, contracts for safety on unit. He is not presenting with significant alcohol withdrawal symptoms at this time-no tremors, no diaphoresis, no restlessness, vitals stable. Visible in dayroom, calm, polite on approach. Labs reviewed as below-LFTs improving/trending down. TSH WNL ( 1.883)  Principal Problem: MDD, Alcohol Use Disorder Diagnosis:   Patient Active Problem List   Diagnosis Date Noted  . MDD (major depressive disorder), recurrent severe, without psychosis (Anthony) [F33.2] 05/24/2018   Total Time spent with patient: 20 minutes  Past Psychiatric History:   Past Medical History:  Past Medical History:  Diagnosis Date  . Depression   . GERD (gastroesophageal reflux disease)   . Hives   . Migraine    History reviewed. No pertinent surgical history. Family History: History reviewed. No pertinent family history. Family Psychiatric  History:  Social History:  Social History   Substance and Sexual Activity  Alcohol Use Yes   Comment: occasionally     Social History   Substance and Sexual Activity  Drug Use No    Social History   Socioeconomic History  . Marital status: Legally Separated     Spouse name: Not on file  . Number of children: Not on file  . Years of education: Not on file  . Highest education level: Not on file  Occupational History  . Not on file  Social Needs  . Financial resource strain: Not on file  . Food insecurity:    Worry: Not on file    Inability: Not on file  . Transportation needs:    Medical: Not on file    Non-medical: Not on file  Tobacco Use  . Smoking status: Current Every Day Smoker    Packs/day: 1.00    Types: Cigarettes  . Smokeless tobacco: Never Used  Substance and Sexual Activity  . Alcohol use: Yes    Comment: occasionally  . Drug use: No  . Sexual activity: Not on file  Lifestyle  . Physical activity:    Days per week: Not on file    Minutes per session: Not on file  . Stress: Not on file  Relationships  . Social connections:    Talks on phone: Not on file    Gets together: Not on file    Attends religious service: Not on file    Active member of club or organization: Not on file    Attends meetings of clubs or organizations: Not on file    Relationship status: Not on file  Other Topics Concern  . Not on file  Social History Narrative  . Not on file   Additional Social History:   Sleep: Good  Appetite:  Improving  Current Medications: Current Facility-Administered Medications  Medication Dose Route Frequency Provider Last Rate Last Dose  . famotidine (PEPCID) tablet 20  mg  20 mg Oral Daily Lindon Romp A, NP   20 mg at 05/26/18 0743  . hydrOXYzine (ATARAX/VISTARIL) tablet 25 mg  25 mg Oral Q6H PRN Cobos, Myer Peer, MD   25 mg at 05/26/18 0743  . ibuprofen (ADVIL,MOTRIN) tablet 600 mg  600 mg Oral Q6H PRN Rankin, Shuvon B, NP   600 mg at 05/26/18 0745  . loperamide (IMODIUM) capsule 2-4 mg  2-4 mg Oral PRN Cobos, Myer Peer, MD      . LORazepam (ATIVAN) tablet 1 mg  1 mg Oral Q6H PRN Cobos, Myer Peer, MD   1 mg at 05/25/18 2311  . mirtazapine (REMERON) tablet 15 mg  15 mg Oral QHS Cobos, Myer Peer, MD    15 mg at 05/25/18 2156  . multivitamin with minerals tablet 1 tablet  1 tablet Oral Daily Cobos, Myer Peer, MD   1 tablet at 05/26/18 0743  . nicotine (NICODERM CQ - dosed in mg/24 hours) patch 21 mg  21 mg Transdermal Daily Cobos, Myer Peer, MD   21 mg at 05/26/18 0821  . ondansetron (ZOFRAN-ODT) disintegrating tablet 4 mg  4 mg Oral Q6H PRN Cobos, Fernando A, MD      . thiamine (VITAMIN B-1) tablet 100 mg  100 mg Oral Daily Cobos, Myer Peer, MD   100 mg at 05/26/18 8144    Lab Results:  Results for orders placed or performed during the hospital encounter of 05/24/18 (from the past 48 hour(s))  Basic metabolic panel     Status: None   Collection Time: 05/26/18  6:04 AM  Result Value Ref Range   Sodium 144 135 - 145 mmol/L   Potassium 3.8 3.5 - 5.1 mmol/L   Chloride 110 98 - 111 mmol/L    Comment: Please note change in reference range.   CO2 28 22 - 32 mmol/L   Glucose, Bld 89 70 - 99 mg/dL    Comment: Please note change in reference range.   BUN 13 6 - 20 mg/dL    Comment: Please note change in reference range.   Creatinine, Ser 0.80 0.61 - 1.24 mg/dL   Calcium 9.0 8.9 - 10.3 mg/dL   GFR calc non Af Amer >60 >60 mL/min   GFR calc Af Amer >60 >60 mL/min    Comment: (NOTE) The eGFR has been calculated using the CKD EPI equation. This calculation has not been validated in all clinical situations. eGFR's persistently <60 mL/min signify possible Chronic Kidney Disease.    Anion gap 6 5 - 15    Comment: Performed at [facility], [place] [address] [place], [place] [address]  Hepatic function panel     Status: Abnormal   Collection Time: 05/26/18  6:04 AM  Result Value Ref Range   Total Protein 6.9 6.5 - 8.1 g/dL   Albumin 3.8 3.5 - 5.0 g/dL   AST 68 (H) 15 - 41 U/L   ALT 35 0 - 44 U/L    Comment: Please note change in reference range.   Alkaline Phosphatase 56 38 - 126 U/L   Total Bilirubin 1.0 0.3 - 1.2 mg/dL   Bilirubin, Direct 0.1 0.0 - 0.2 mg/dL     Comment: Please note change in reference range.   Indirect Bilirubin 0.9 0.3 - 0.9 mg/dL    Comment: Performed at [facility], [place] [address], [place], [place] [address]  TSH     Status: None   Collection Time: 05/26/18  6:04 AM  Result Value Ref Range   TSH 1.883 0.350 - 4.500 uIU/mL    Comment: Performed by a 3rd Generation assay with a functional sensitivity of <=0.01 uIU/mL. Performed at [facility], [place] [address] [place], [place] [address]     Blood Alcohol level:  Lab Results  Component Value Date   ETH 203 (H) 20/08/711    Metabolic Disorder Labs: No results found for: HGBA1C, MPG No results found for: PROLACTIN No results found for: CHOL, TRIG, HDL, CHOLHDL, VLDL, LDLCALC  Physical Findings: AIMS: Facial and Oral Movements Muscles of Facial Expression: None, normal Lips and Perioral Area: None, normal Jaw: None, normal Tongue: None, normal,Extremity Movements Upper (arms, wrists, hands, fingers): None, normal Lower (legs, knees, ankles, toes): None, normal, Trunk Movements Neck, shoulders, hips: None, normal, Overall Severity Severity of abnormal movements (highest score from questions above): None, normal Incapacitation due to abnormal movements: None, normal Patient's awareness of abnormal movements (rate only patient's report): No Awareness, Dental Status Current problems with teeth and/or dentures?: No Does patient usually wear dentures?: Yes  CIWA:  CIWA-Ar Total: 3 COWS:     Musculoskeletal: Strength & Muscle Tone: within normal limits-no tremors, no psychomotor agitation Gait & Station: normal Patient leans: N/A  Psychiatric Specialty Exam: Physical Exam  ROS denies chest pain, no shortness of breath, no vomiting  Blood pressure 137/85, pulse 73, temperature 98.1 F (36.7 C), temperature source Oral, resp. rate 18, height 5' 9"  (1.753 m), weight 56.2 kg (124 lb), SpO2 100 %.Body mass index is 18.31 kg/m.   General Appearance: Fairly Groomed  Eye Contact:  Good  Speech:  Normal Rate  Volume:  Normal  Mood:  Reports partial improvement compared to admission less depressed  Affect:  Remains constricted, smiles at times appropriately  Thought Process:  Linear and Descriptions of Associations: Intact  Orientation:  Full (Time, Place, and Person)  Thought Content:  No hallucinations, no delusions, not internally preoccupied  Suicidal Thoughts:  No-denies suicidal or self-injurious ideations and currently contracts for safety  Homicidal Thoughts:  No  Memory:  Recent and remote grossly intact  Judgement:  Other:  Improving  Insight:  Improving  Psychomotor Activity:  Normal-no restlessness or psychomotor agitation, no diaphoresis, no tremors  Concentration:  Concentration: Good and Attention Span: Good  Recall:  Good  Fund of Knowledge:  Good  Language:  Good  Akathisia:  Negative  Handed:  Right  AIMS (if indicated):     Assets:  Communication Skills Desire for Improvement Resilience  ADL's:  Intact  Cognition:  WNL  Sleep:  Number of Hours: 6   Assessment: Patient reports partially improved mood, feeling less depressed , and is denying any suicidal ideations at present.  Affect remains constricted, but smiles at times appropriately.  Not currently presenting with significant alcohol withdrawal symptoms.  Treatment Plan Summary: Daily contact with patient to assess and evaluate symptoms and progress in treatment, Medication management, Plan Inpatient treatment and Medications as below Encourage group and milieu participation to work on coping skills and symptom reduction Encourage efforts to work on sobriety and relapse prevention Continue Ativan PRN's for alcohol withdrawal if needed Continue Remeron 15 mg nightly for depression and insomnia Treatment team working on disposition planning options.  Jenne Campus, MD 05/26/2018, 10:52 AM

## 2018-05-26 NOTE — Progress Notes (Signed)
Patient ID: Frank Vincent, male   DOB: 01/09/1971, 47 y.o.   MRN: [id number] D: Patient is calm and cooperative, mood is depressed, however pt is pleasant, and is visible in the milieu interacting with peers.  Pt reports that his sleep quality last night was fair, reports his appetite as being good, reports a normal energy level and reports a good concentration level.  Pt rates his depression as 3 (10 being the highest), rates his anxiety level as 2 (10 being the highest), and denies being hopeless.  A: Pt was educated on all of his medications and given all as scheduled.  Pt was medicated with Motrin 600mg  for complaints of right foot pain, and medicated with Atarax 25mg  for complaints of anxiety.  Pt is being maintained on Q15 minute checks for safety.  R: Pt denies any current concerns, will continue to monitor on Q15 minute safety checks.

## 2018-05-26 NOTE — [facility] Suicide Risk Assessment (Signed)
[facility] INPATIENT:  Family/Significant Other Suicide Prevention Education  Suicide Prevention Education:  Education Completed; Frank Vincent, daughter, [phone],  (name of family member/significant other) has been identifieLedell Peoplesd by the patient as the family member/significant other with whom the patient will be residing, and identified as the person(s) who will aid the patient in the event of a mental health crisis (suicidal ideations/suicide attempt).  With written consent from the patient, the family member/significant other has been provided the following suicide prevention education, prior to the and/or following the discharge of the patient.  The suicide prevention education provided includes the following:  Suicide risk factors  Suicide prevention and interventions  National Suicide Hotline telephone number  [facility] assessment telephone number  [facility] Assistance 911  [facility] and/or [facility]idential [facility] Crisis [facility] telephone number  Request made of family/significant other to:  Remove weapons (e.g., guns, rifles, knives), all items previously/currently identified as safety concern.    Remove drugs/medications (over-the-counter, prescriptions, illicit drugs), all items previously/currently identified as a safety concern.  The family member/significant other verbalizes understanding of the suicide prevention education information provided.  The family member/significant other agrees to remove the items of safety concern listed above. Daughter, Frank Vincent reported that she has been a patient herself a couple of times and that she is very much versed in suicide prevention. Reports she has the contact numbers also. Reports concerns that he feels like he needs to be doing something grand with his life to be reaching his full potential. Reports feeling that he needs medications and we have a plan in place for him to have more support at home upon  discharge.   Frank Vincent 05/26/2018, 2:53 PM

## 2018-05-27 NOTE — Plan of Care (Signed)
D: Pt denies SI/HI/AV hallucinations. Patient is in a pleasant mood. Patient goal is to work on discharge plans. A: Pt was offered support and encouragement. Pt was given scheduled medications. Pt was encourage to attend groups. Q 15 minute checks were done for safety.  R:Pt attends groups and interacts well with peers and staff. Pt is taking medication. Pt has no complaints.Pt receptive to treatment and safety maintained on unit.    Problem: Safety: Goal: Periods of time without injury will increase Outcome: Progressing Note:  Patient denies SI   Problem: Activity: Goal: Sleeping patterns will improve Outcome: Progressing Note:  Patient is resting undisturbed at night

## 2018-05-27 NOTE — Progress Notes (Signed)
D: Pt was in dayroom upon initial approach.  He presents with anxious affect and mood.  Pt reports he is "feeling good."  His goal is "to have as good a day as I did yesterday and set up a time to get out of here and I did all that."  Pt discussed how he may discharge Monday or Tuesday.  He denies SI/HI, hallucinations, withdrawal symptoms, and pain.  Pt has been visible in milieu interacting with peers and staff appropriately.  He attended evening group.  A: Introduced self to pt.  Actively listened to pt and provided support and encouragement.  Medication administered per order.  PRN medication administered for anxiety.  Q15 minute safety checks maintained.  R: Pt is compliant with medications.  He verbally contracts for safety and reports he will inform staff of needs and concerns.  Will continue to monitor and assess.

## 2018-05-27 NOTE — Plan of Care (Signed)
  Problem: Activity: Goal: Sleeping patterns will improve Outcome: Progressing Note:  Pt slept 6.75 hours last night.   

## 2018-05-27 NOTE — Progress Notes (Signed)
Pt presents with an anxious mood. Pt rated on his self inventory sheet: depression 0, anxiety 0, hopelessness 0, sleep-good and appetite-good. Pt expressed decreased depression and anxiety today. Pt denies SI/HI. Pt expressed readiness for discharge on Monday. No concerns verbalized by pt.  Pt compliant with taking his medications and denies any side effects.  Orders reviewed with pt. Verbal support provided. V/s assessed. Pt self inventory sheet completed. 15 minute checks performed for safety.   Pt compliant with tx plan.

## 2018-05-27 NOTE — [facility] Group Notes (Signed)
[facility] LCSW Group Therapy Note  05/27/2018  10:00-11:00AM  Type of Therapy and Topic:  Group Therapy:  Being Your Own Support  Participation Level:  Active   Description of Group:  Patients in this group were introduced to the concept that self-support is an essential part of recovery.  A song entitled "My Own Hero" was played and a group discussion ensued in which patients stated they could relate to the song and it inspired them to realize they have be willing to help themselves in order to succeed, because other people cannot achieve sobriety or stability for them.  We discussed adding a variety of healthy supports to address the various needs in their lives.  A song was played called "I Know Where I've Been" toward the end of group and used to conduct an inspirational wrap-up to group of remembering how far they have already come in their journey.  Therapeutic Goals: 1)  demonstrate the importance of being a part of one's own support system 2)  discuss reasons people in one's life may eventually be unable to be continually supportive  3)  identify the patient's current support system and   4)  elicit commitments to add healthy supports and to become more conscious of being self-supportive   Summary of Patient Progress:  The patient expressed that his daughter has a deep understanding of him and is the reason he is in the hospital and not in a morgue.  He interacted well and appropriately throughout group.   Therapeutic Modalities:   Motivational Interviewing Activity  Lynnell ChadMareida J Grossman-Orr

## 2018-05-27 NOTE — Progress Notes (Signed)
[facility] MD Progress Note  05/27/2018 3:53 PM Frank Vincent  MRN:  [id number] Subjective: Patient states he is feeling really irritated today which she attributes to "people asking me the same questions over and over".  However, he states that in general he is feeling "a lot better" which he attributes mainly to an improved disposition plan .  States "I thought was going to have to return " (referring to the apartment where he was living, currently without basic services ), "but my daughter said I can live with her , she has a finished basement I can stay in "-this has alleviated his stress and anxiety significantly .  Currently denies suicidal ideations .Denies medication side effects. Objective:  I have reviewed chart notes and have met with patient. 47 year old male, presented to ED due to worsening depression, suicidal ideations, related partially to significant financial stressors/difficulties.  Has been drinking heavily(in binges) over recent weeks. Patient presented slightly irritable at first but affect was fully reactive and improved significantly during session.  As above, states that he will be able to stay with his adult daughter after discharge, and describes a significant sense of relief due to this, as he states "I simply cannot afford to stay where I have been living". Currently denies medication side effects-he feels Remeron has been effective and well-tolerated thus far.   Visible in dayroom, going to groups .  As he improves he is focusing more on discharge planning and is currently hoping for discharge soon. Denies suicidal ideations.   Principal Problem: MDD, Alcohol Use Disorder Diagnosis:   Patient Active Problem List   Diagnosis Date Noted  . MDD (major depressive disorder), recurrent severe, without psychosis ([place]) [F33.2] 05/24/2018   Total Time spent with patient: 20 minutes  Past Psychiatric History:   Past Medical History:  Past Medical History:  Diagnosis Date  .  Depression   . GERD (gastroesophageal reflux disease)   . Hives   . Migraine    History reviewed. No pertinent surgical history. Family History: History reviewed. No pertinent family history. Family Psychiatric  History:  Social History:  Social History   Substance and Sexual Activity  Alcohol Use Yes   Comment: occasionally     Social History   Substance and Sexual Activity  Drug Use No    Social History   Socioeconomic History  . Marital status: Legally Separated    Spouse name: Not on file  . Number of children: Not on file  . Years of education: Not on file  . Highest education level: Not on file  Occupational History  . Not on file  Social Needs  . Financial resource strain: Not on file  . Food insecurity:    Worry: Not on file    Inability: Not on file  . Transportation needs:    Medical: Not on file    Non-medical: Not on file  Tobacco Use  . Smoking status: Current Every Day Smoker    Packs/day: 1.00    Types: Cigarettes  . Smokeless tobacco: Never Used  Substance and Sexual Activity  . Alcohol use: Yes    Comment: occasionally  . Drug use: No  . Sexual activity: Not on file  Lifestyle  . Physical activity:    Days per week: Not on file    Minutes per session: Not on file  . Stress: Not on file  Relationships  . Social connections:    Talks on phone: Not on file    Gets together:  Not on file    Attends religious service: Not on file    Active member of club or organization: Not on file    Attends meetings of clubs or organizations: Not on file    Relationship status: Not on file  Other Topics Concern  . Not on file  Social History Narrative  . Not on file   Additional Social History:   Sleep: Good  Appetite:  Improving  Current Medications: Current Facility-Administered Medications  Medication Dose Route Frequency Provider Last Rate Last Dose  . famotidine (PEPCID) tablet 20 mg  20 mg Oral Daily Lindon Romp A, NP   20 mg at 05/27/18  0803  . hydrOXYzine (ATARAX/VISTARIL) tablet 25 mg  25 mg Oral Q6H PRN Cobos, Myer Peer, MD   25 mg at 05/26/18 2101  . ibuprofen (ADVIL,MOTRIN) tablet 600 mg  600 mg Oral Q6H PRN Rankin, Shuvon B, NP   600 mg at 05/26/18 0745  . loperamide (IMODIUM) capsule 2-4 mg  2-4 mg Oral PRN Cobos, Myer Peer, MD      . LORazepam (ATIVAN) tablet 1 mg  1 mg Oral Q6H PRN Cobos, Myer Peer, MD   1 mg at 05/25/18 2311  . mirtazapine (REMERON) tablet 15 mg  15 mg Oral QHS Cobos, Myer Peer, MD   15 mg at 05/26/18 2101  . multivitamin with minerals tablet 1 tablet  1 tablet Oral Daily Cobos, Myer Peer, MD   1 tablet at 05/27/18 0803  . nicotine (NICODERM CQ - dosed in mg/24 hours) patch 21 mg  21 mg Transdermal Daily Cobos, Myer Peer, MD   21 mg at 05/27/18 0805  . ondansetron (ZOFRAN-ODT) disintegrating tablet 4 mg  4 mg Oral Q6H PRN Cobos, Fernando A, MD      . thiamine (VITAMIN B-1) tablet 100 mg  100 mg Oral Daily Cobos, Myer Peer, MD   100 mg at 05/27/18 0803    Lab Results:  Results for orders placed or performed during the hospital encounter of 05/24/18 (from the past 48 hour(s))  Basic metabolic panel     Status: None   Collection Time: 05/26/18  6:04 AM  Result Value Ref Range   Sodium 144 135 - 145 mmol/L   Potassium 3.8 3.5 - 5.1 mmol/L   Chloride 110 98 - 111 mmol/L    Comment: Please note change in reference range.   CO2 28 22 - 32 mmol/L   Glucose, Bld 89 70 - 99 mg/dL    Comment: Please note change in reference range.   BUN 13 6 - 20 mg/dL    Comment: Please note change in reference range.   Creatinine, Ser 0.80 0.61 - 1.24 mg/dL   Calcium 9.0 8.9 - 10.3 mg/dL   GFR calc non Af Amer >60 >60 mL/min   GFR calc Af Amer >60 >60 mL/min    Comment: (NOTE) The eGFR has been calculated using the CKD EPI equation. This calculation has not been validated in all clinical situations. eGFR's persistently <60 mL/min signify possible Chronic Kidney Disease.    Anion gap 6 5 - 15    Comment:  Performed at [facility], [place] [address], [place], [place] [address]  Hepatic function panel     Status: Abnormal   Collection Time: 05/26/18  6:04 AM  Result Value Ref Range   Total Protein 6.9 6.5 - 8.1 g/dL   Albumin 3.8 3.5 - 5.0 g/dL   AST 68 (H) 15 - 41 U/L  ALT 35 0 - 44 U/L    Comment: Please note change in reference range.   Alkaline Phosphatase 56 38 - 126 U/L   Total Bilirubin 1.0 0.3 - 1.2 mg/dL   Bilirubin, Direct 0.1 0.0 - 0.2 mg/dL    Comment: Please note change in reference range.   Indirect Bilirubin 0.9 0.3 - 0.9 mg/dL    Comment: Performed at [facility], [place] [address], [place], [place] [address]  TSH     Status: None   Collection Time: 05/26/18  6:04 AM  Result Value Ref Range   TSH 1.883 0.350 - 4.500 uIU/mL    Comment: Performed by a 3rd Generation assay with a functional sensitivity of <=0.01 uIU/mL. Performed at [facility], [place] [address], [place], [place] [address]     Blood Alcohol level:  Lab Results  Component Value Date   ETH 203 (H) 63/11/6008    Metabolic Disorder Labs: No results found for: HGBA1C, MPG No results found for: PROLACTIN No results found for: CHOL, TRIG, HDL, CHOLHDL, VLDL, LDLCALC  Physical Findings: AIMS: Facial and Oral Movements Muscles of Facial Expression: None, normal Lips and Perioral Area: None, normal Jaw: None, normal Tongue: None, normal,Extremity Movements Upper (arms, wrists, hands, fingers): None, normal Lower (legs, knees, ankles, toes): None, normal, Trunk Movements Neck, shoulders, hips: None, normal, Overall Severity Severity of abnormal movements (highest score from questions above): None, normal Incapacitation due to abnormal movements: None, normal Patient's awareness of abnormal movements (rate only patient's report): No Awareness, Dental Status Current problems with teeth and/or dentures?: No Does patient usually wear dentures?: Yes   CIWA:  CIWA-Ar Total: 0 COWS:     Musculoskeletal: Strength & Muscle Tone: within normal limits-no tremors, no psychomotor agitation Gait & Station: normal Patient leans: N/A  Psychiatric Specialty Exam: Physical Exam  ROS denies chest pain, no shortness of breath, no vomiting  Blood pressure 116/89, pulse 80, temperature 98.5 F (36.9 C), temperature source Oral, resp. rate 18, height 5' 9"  (1.753 m), weight 56.2 kg (124 lb), SpO2 98 %.Body mass index is 18.31 kg/m.  General Appearance: Improving grooming  Eye Contact:  Good  Speech:  Normal Rate  Volume:  Normal  Mood:  Reports improving mood  Affect:  Vaguely irritable, more reactive, brightens during session  Thought Process:  Linear and Descriptions of Associations: Intact  Orientation:  Full (Time, Place, and Person)  Thought Content:  No hallucinations, no delusions, not internally preoccupied  Suicidal Thoughts:  No-denies suicidal or self-injurious ideations and currently contracts for safety  Homicidal Thoughts:  No  Memory:  Recent and remote grossly intact  Judgement:  Other:  Improving  Insight:  Improving  Psychomotor Activity:  Normal-no restlessness or psychomotor agitation, no diaphoresis, no tremors  Concentration:  Concentration: Good and Attention Span: Good  Recall:  Good  Fund of Knowledge:  Good  Language:  Good  Akathisia:  Negative  Handed:  Right  AIMS (if indicated):     Assets:  Communication Skills Desire for Improvement Resilience  ADL's:  Intact  Cognition:  WNL  Sleep:  Number of Hours: 6.75   Assessment: Patient reports feeling significantly better than prior to admission.  Currently describes mood as much improved, denies suicidal ideations and presents future oriented.  Improvement is at least partially due to finding out he can go stay with his adult daughter after discharge (housing/financial stressors have been a major contributor to depression and anxiety). Currently tolerating  Remeron well.  Denies suicidal ideations.  Treatment Plan Summary: Daily contact with patient to assess and evaluate symptoms and progress in treatment, Medication management, Plan Inpatient treatment and Medications as below Encourage group and milieu participation to work on coping skills and symptom reduction Encourage efforts to work on sobriety and relapse prevention Continue Ativan PRN's for alcohol withdrawal if needed Continue Remeron 15 mg nightly for depression and insomnia Treatment team working on disposition planning options.  Jenne Campus, MD 05/27/2018, 3:53 PM   Patient ID: Willeen Cass, male   DOB: 16-Sep-1971, 47 y.o.   MRN: [id number]

## 2018-05-27 NOTE — Progress Notes (Signed)
The patient discussed in group that he was pleased with the fact that he has been changed from involuntary status to voluntary status. He also mentioned that his medications have been ordered and is beginning to feel better. His goal for tomorrow is to begin working on the discharge process.

## 2018-05-28 DIAGNOSIS — F1014 Alcohol abuse with alcohol-induced mood disorder: Secondary | ICD-10-CM

## 2018-05-28 MED ORDER — HYDROXYZINE HCL 25 MG PO TABS
25.0000 mg | ORAL_TABLET | Freq: Every day | ORAL | Status: DC | PRN
  Filled 2018-05-28: qty 7

## 2018-05-28 MED ORDER — FAMOTIDINE 20 MG PO TABS: 20.0000 mg | ORAL_TABLET | Freq: Every day | ORAL | 0 refills | Status: DC

## 2018-05-28 MED ORDER — HYDROXYZINE HCL 25 MG PO TABS: ORAL_TABLET | ORAL | 0 refills | Status: DC

## 2018-05-28 MED ORDER — NICOTINE 21 MG/24HR TD PT24: 21.0000 mg | MEDICATED_PATCH | Freq: Every day | TRANSDERMAL | 0 refills | Status: DC

## 2018-05-28 MED ORDER — MIRTAZAPINE 15 MG PO TABS: 15.0000 mg | ORAL_TABLET | Freq: Every day | ORAL | 0 refills | Status: DC
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
## 2018-05-28 NOTE — Progress Notes (Signed)
Patient ID: Frank Vincent, male   DOB: 03/11/1971, 47 y.o.   MRN: [id number]  Patient discharged per MD orders. Patient given education regarding follow-up appointments and medications. Patient denies any questions or concerns about these instructions. Patient was escorted to locker and given belongings before discharge to hospital lobby. Patient currently denies SI/HI and auditory and visual hallucinations on discharge.

## 2018-05-28 NOTE — Progress Notes (Signed)
Recreation Therapy Notes  Date: 7.15.19 Time: 0930 Location: [address] Dayroom  Group Topic: Stress Management  Goal Area(s) Addresses:  Patient will verbalize importance of using healthy stress management.  Patient will identify positive emotions associated with healthy stress management.   Intervention: Stress Management  Activity :  Guided Imagery.  LRT introduced the stress management technique of guided imagery.  LRT read a script that allowed patients to picture their peaceful place.  Patients were to follow along as the script was read to engage in the activity.  Education:  Stress Management, Discharge Planning.   Education Outcome: Acknowledges edcuation/In group clarification offered/Needs additional education  Clinical Observations/Feedback: Pt did not attend group.      Caroll RancherMarjette Nivek Powley, LRT/CTRS         Caroll RancherLindsay, Hoang Reich A 05/28/2018 12:41 PM

## 2018-05-28 NOTE — Progress Notes (Signed)
  [facility] :  Will you be returning to the same living situation after discharge:  Yes,  own home At discharge, do you have transportation home?: Yes,  daughter Do you have the ability to pay for your medications: No. [facility] with [facility].  Release of information consent forms completed and in the chart;  Patient's signature needed at discharge.  Patient to Follow up at: Follow-up Information    [facility]. Go on 05/30/2018.   Why:  Please attend your intake appt on Wednesday, 05/30/18, at 8:30am.  Please bring photo ID, social security card, and proof of household income. Contact information: [address] [place] [id number] [phone]           Next level of care provider [facility]:no  Safety Planning and Suicide Prevention discussed: Yes,  with daughter  Have you used any form of tobacco in the last 30 days? (Cigarettes, Smokeless Tobacco, Cigars, and/or Pipes): Yes  Has patient been referred to the Quitline?: Patient refused referral  Patient has been referred for addiction treatment: Yes  Lorri FrederickWierda, Bayden Gil Jon, LCSW 05/28/2018, 10:44 AM
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
## 2019-04-20 IMAGING — DX DG FOOT COMPLETE 3+V*R*
3 series · 3 of 3 positions shown · non-contrast
Comparison: None available.

CLINICAL DATA: Initial evaluation for acute pain and swelling to
right big toe status post trauma.

EXAM:
RIGHT FOOT COMPLETE - 3+ VIEW

[foot ap]
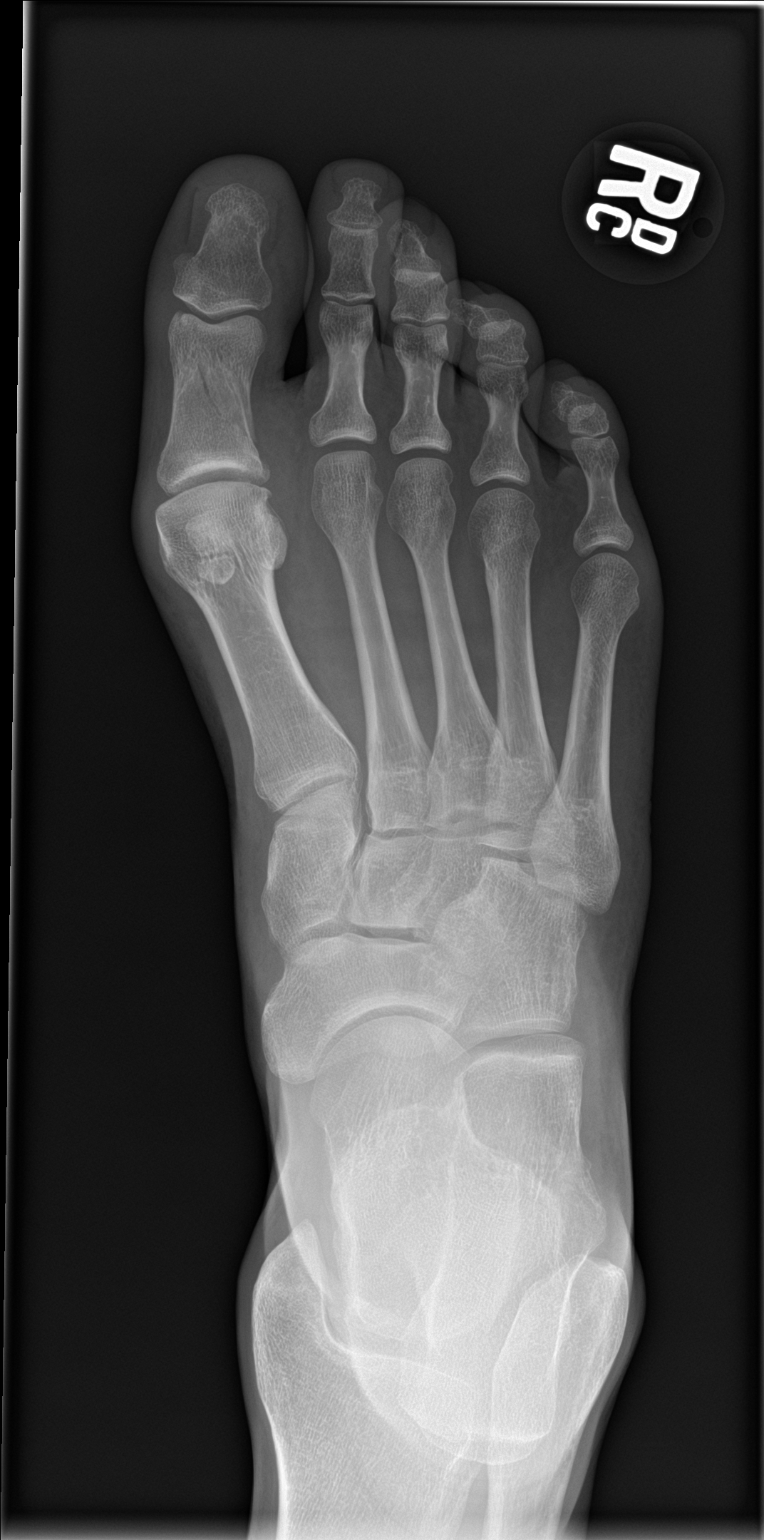

[foot obl]
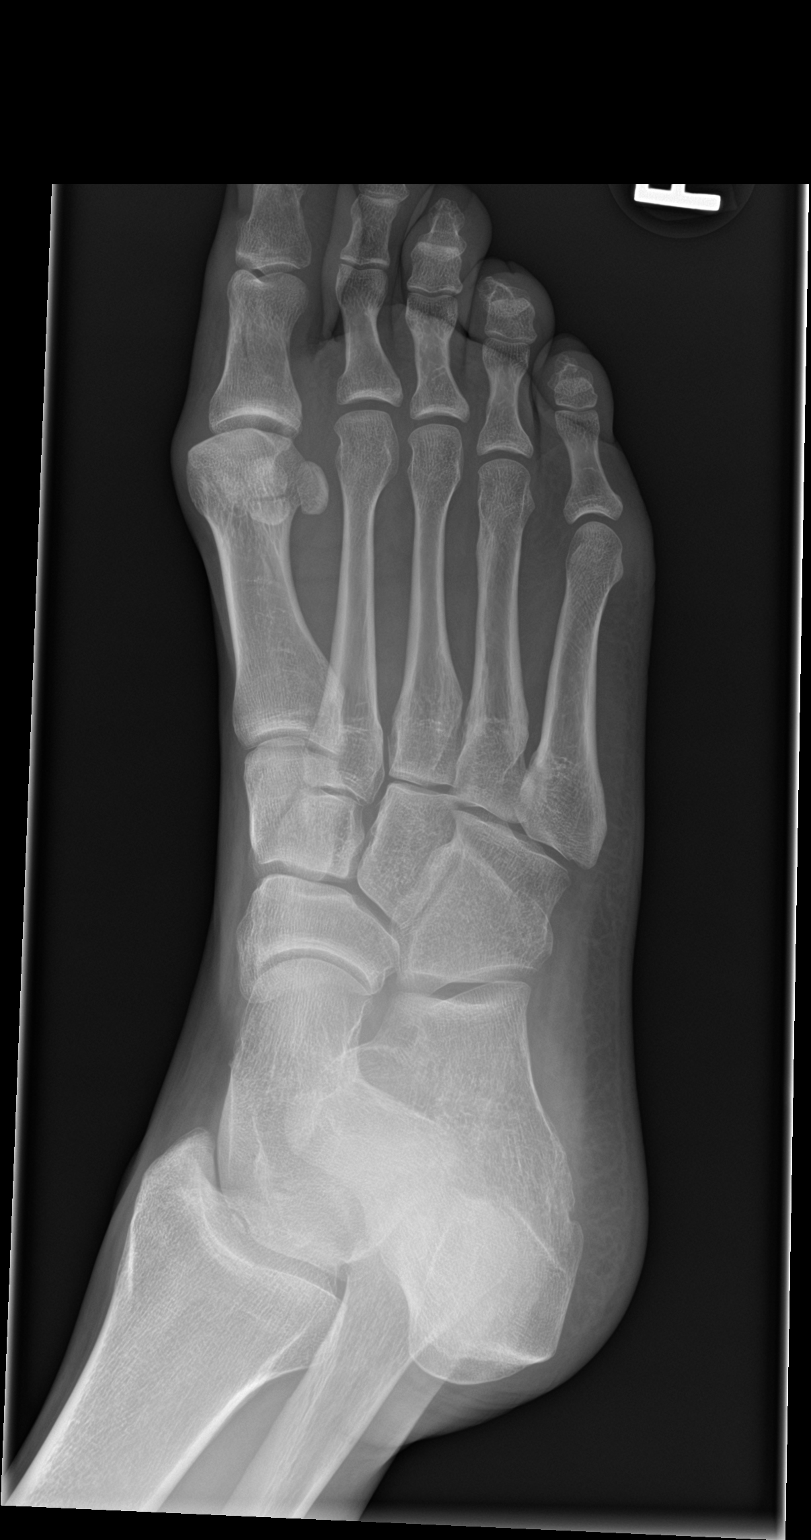

[foot lat]
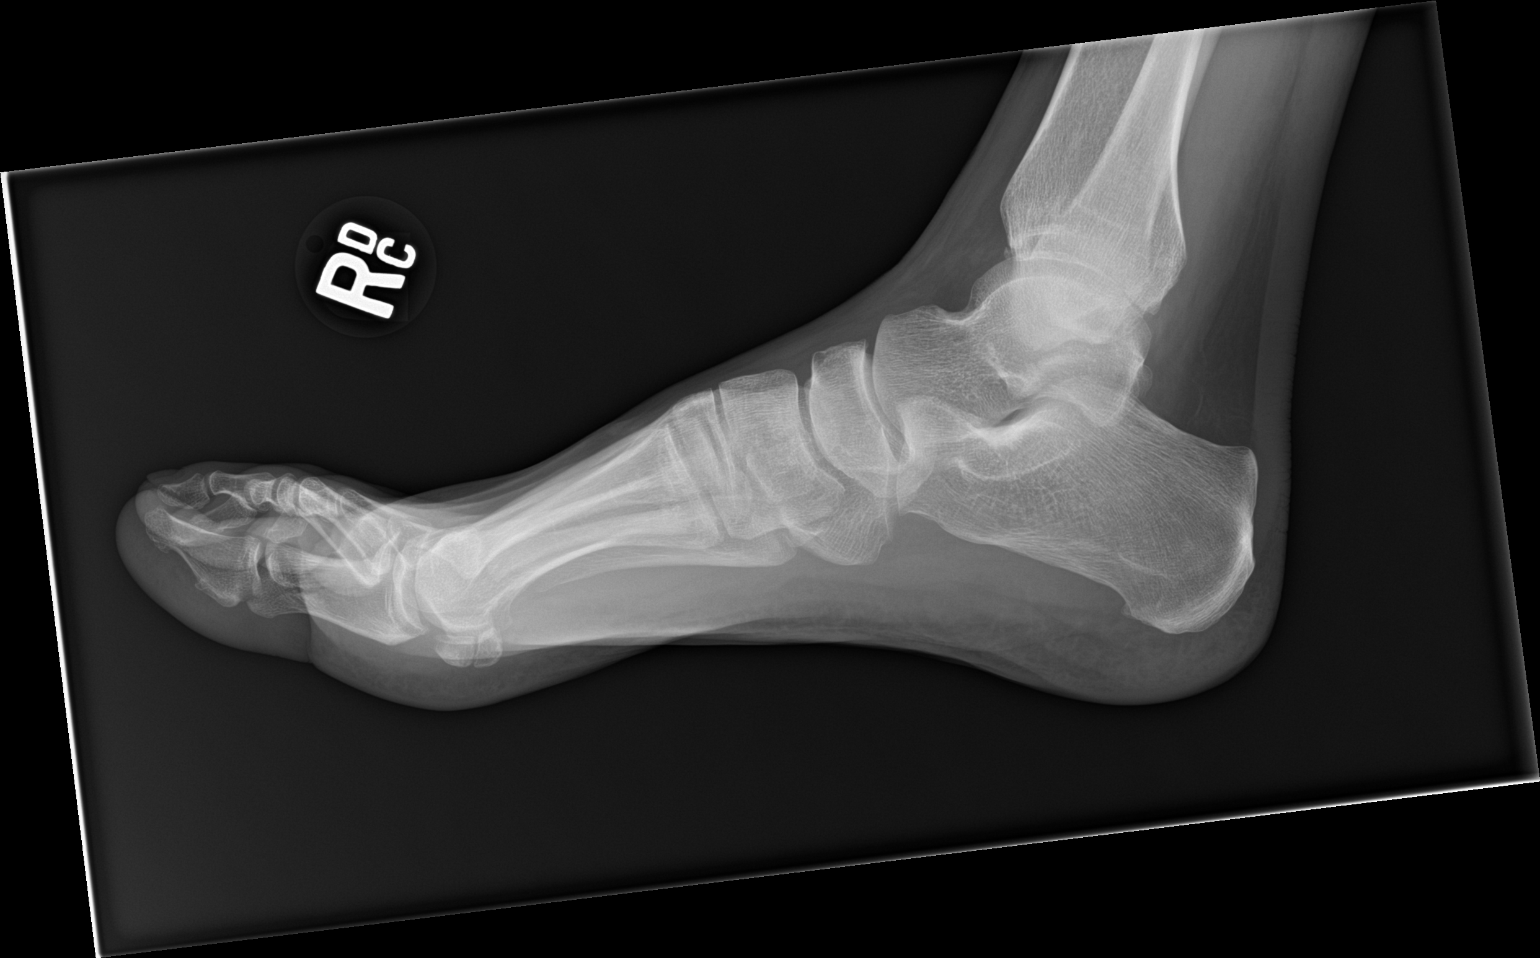

[3 of 3 positions shown; findings below may reference images not displayed]

FINDINGS: Curvilinear lucency extending through the right first proximal
phalanx suspicious for acute nondisplaced fracture. Adjacent right
first distal phalanx and first metatarsal appear intact. Mild
osteoarthritic changes at the first MTP joint. No other acute
osseous abnormality. Small plantar calcaneal enthesophyte noted.
IMPRESSION: Curvilinear lucency extending through the mid aspect of the right
first proximal phalanx, suspicious for acute nondisplaced fracture.
Correlation with physical exam for possible pain at this location
recommended.

## 2024-08-26 ENCOUNTER — Emergency Department (HOSPITAL_COMMUNITY)
Admission: EM | Admit: 2024-08-26 | Discharge: 2024-08-26 | Disposition: A | Discharge status: Home / Self Care | Attending: Emergency Medicine | Admitting: Emergency Medicine

## 2024-08-26 ENCOUNTER — Encounter (HOSPITAL_COMMUNITY): Payer: Self-pay | Admitting: Emergency Medicine

## 2024-08-26 ENCOUNTER — Emergency Department (HOSPITAL_COMMUNITY)

## 2024-08-26 ENCOUNTER — Other Ambulatory Visit: Payer: Self-pay

## 2024-08-26 DIAGNOSIS — F1290 Cannabis use, unspecified, uncomplicated: Secondary | ICD-10-CM | POA: Insufficient documentation

## 2024-08-26 DIAGNOSIS — R442 Other hallucinations: Secondary | ICD-10-CM | POA: Diagnosis not present

## 2024-08-26 DIAGNOSIS — R441 Visual hallucinations: Secondary | ICD-10-CM | POA: Diagnosis present

## 2024-08-26 HISTORY — DX: Cardiac murmur, unspecified: R01.1

## 2024-08-26 HISTORY — DX: Schizophrenia, unspecified: F20.9

## 2024-08-26 HISTORY — DX: Anxiety disorder, unspecified: F41.9

## 2024-08-26 HISTORY — DX: Bipolar disorder, unspecified: F31.9

## 2024-08-26 LAB — CBC WITH DIFFERENTIAL/PLATELET
Abs Immature Granulocytes: 0.01 K/uL (ref 0.00–0.07)
Basophils Absolute: 0 K/uL (ref 0.0–0.1)
Basophils Relative: 1 %
Eosinophils Absolute: 0.5 K/uL (ref 0.0–0.5)
Eosinophils Relative: 7 %
HCT: 36.6 % — ABNORMAL LOW (ref 39.0–52.0)
Hemoglobin: 12.5 g/dL — ABNORMAL LOW (ref 13.0–17.0)
Immature Granulocytes: 0 %
Lymphocytes Relative: 19 %
Lymphs Abs: 1.5 K/uL (ref 0.7–4.0)
MCH: 31.8 pg (ref 26.0–34.0)
MCHC: 34.2 g/dL (ref 30.0–36.0)
MCV: 93.1 fL (ref 80.0–100.0)
Monocytes Absolute: 0.5 K/uL (ref 0.1–1.0)
Monocytes Relative: 7 %
Neutro Abs: 5.3 K/uL (ref 1.7–7.7)
Neutrophils Relative %: 66 %
Platelets: 248 K/uL (ref 150–400)
RBC: 3.93 MIL/uL — ABNORMAL LOW (ref 4.22–5.81)
RDW: 13.3 % (ref 11.5–15.5)
WBC: 8 K/uL (ref 4.0–10.5)
nRBC: 0 % (ref 0.0–0.2)

## 2024-08-26 LAB — COMPREHENSIVE METABOLIC PANEL WITH GFR
ALT: 13 U/L (ref 0–44)
AST: 24 U/L (ref 15–41)
Albumin: 3.9 g/dL (ref 3.5–5.0)
Alkaline Phosphatase: 66 U/L (ref 38–126)
Anion gap: 14 (ref 5–15)
BUN: 22 mg/dL — ABNORMAL HIGH (ref 6–20)
CO2: 20 mmol/L — ABNORMAL LOW (ref 22–32)
Calcium: 9.1 mg/dL (ref 8.9–10.3)
Chloride: 101 mmol/L (ref 98–111)
Creatinine, Ser: 1.04 mg/dL (ref 0.61–1.24)
GFR, Estimated: >60 mL/min (ref >60)
Glucose, Bld: 99 mg/dL (ref 70–99)
Potassium: 3.6 mmol/L (ref 3.5–5.1)
Sodium: 135 mmol/L (ref 135–145)
Total Bilirubin: 1.3 mg/dL — ABNORMAL HIGH (ref 0.0–1.2)
Total Protein: 7.1 g/dL (ref 6.5–8.1)

## 2024-08-26 LAB — RAPID URINE DRUG SCREEN, HOSP PERFORMED
Amphetamines: POSITIVE — AB
Barbiturates: NOT DETECTED
Benzodiazepines: NOT DETECTED
Cocaine: NOT DETECTED
Opiates: NOT DETECTED
Tetrahydrocannabinol: POSITIVE — AB

## 2024-08-26 LAB — CBG MONITORING, ED: Glucose-Capillary: 112 mg/dL — ABNORMAL HIGH (ref 70–99)

## 2024-08-26 LAB — ETHANOL: Alcohol, Ethyl (B): <15 mg/dL (ref <15)
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
## 2024-08-26 NOTE — Discharge Instructions (Signed)
 Based on what you have shared, a list of resources for outpatient therapy and psychiatry is provided below to get you started back on treatment.  It is imperative that you follow through with treatment within 5-7 days from the day of discharge to prevent any further risk to your safety or mental well-being.  You are not limited to the list provided.  In case of an urgent crisis, you may contact the Mobile Crisis Unit with [facility] at [phone].        Outpatient Services for Therapy and Medication Management for [facility]  [facility] [address], [place], [address] [phone] phone  New Patient Psychiatry/Medication Management Walk-ins Monday-Friday: 7am  For all walk-ins, we ask that you arrive by 7:00 am because patient will be seen in the order of arrival.  Availability is limited; therefore, you may not be seen on the same day that you walk-in.  Our goal is to serve and meet the needs of our community to the best of our ability.   [facility] [address] [place], [place], [address] [phone] phone  [facility] [address] [place], [place], [address] [phone] phone [phone] phone ([address], Anthem/Elevance, [address], Centivo, [address], [address], Healthy [place], [place], [place], [address], [employer]oco[employer]illips, [place], UHC, American Financial, [place], Out of Network)  [employer], [place] [address] [place], [place], [address] [phone] phone ([place], Anthem/Elevance, Sanmina-SCI Options/Carelon, BCBS, [address], [place], [place], [place], [place], [employer], Center, [place], [place], [facility])  [employer] [facility] [address] [place], [place], [address] [phone] phone (Medicaid, ask about other insurance)  [facility] [address], [address] [place], [place],  [address] [phone] phone [phone] fax ([address] [place] , [place], [place], [place] Health Choice, UHC, General Electric, Self-Pay)  Sarah Lempka [address] [place], [place], [address] [phone] phone ([address], Anthem/Elevance, [address], [address], [place], [employer]X [employer], [place], [place], [place], [employer], [place], [place], [place], [facility])  [facility] - 6-8 MONTH WAIT FOR THERAPY; [facility] FOR MEDICATION MANAGEMENT [address] [place], [place], [address] [phone] phone ([address], AmeriHealth [address] [place], [address], [place], [place], Friday Health Plans, [address], BCBS Healthy [place], [place], [address], [place], [place], [place], [place], Tricare, UHC, [employer]co Corporation, [place])  [facility] [address], [address] [place], [place], [address] [phone] phone  [facility] [address], [address] [place], [place], [address] [phone] phone  [facility] [address] [place], [place], [address] [phone] phone  [facility] - THERAPY ONLY [address]  [place], [place], [address] [phone] phone  [facility], [place] [address] [place], [place], [address] [phone] phone  [facility] [address] [place], [place], [address] [phone] phone [phone] fax  [facility] [address] [place], [place], [address] [phone] phone [phone] fax  [facility] [phone] [address] [place], [place], [address] [phone] phone  Janit Griffins [address] [place], [place], [address]  [phone] phone  [facility]  (Adults Only) [address] [place], [place], [address]  [phone] phone [phone] fax

## 2024-08-26 NOTE — Consult Note (Signed)
 [facility] Psychiatric Consult Initial  Patient Name: .Frank Vincent  MRN: [id number]  DOB: [birth year]  Consult Order details:  Orders (From admission, onward)     Start     Ordered   08/26/24 0625  CONSULT TO CALL ACT TEAM       Ordering Provider: Hoy Nidia FALCON, PA-C  Provider:  (Not yet assigned)  Question:  Reason for Consult?  Answer:  anxiety   08/26/24 0624             Mode of Visit: In person    Psychiatry Consult Evaluation  Service Date: August 26, 2024 LOS:  LOS: 0 days  Chief Complaint visual hallucinations   Primary Psychiatric Diagnoses  Visual Hallucinations  2.  Cannabis use   Assessment  Frank Vincent is a 53 y.o. male admitted: Presented to the EDfor 08/26/2024  6:02 AM for feelings of being stabbed in upper extremities repeatedly that began around 1 and visual hallucinations of seeing bugs when he looks at the lights. He carries the psychiatric diagnoses of MDD and a reported history of bipolar and no significant medical history.  Discussed psychiatric admission for medication management and visual hallucinations.  Patient adamantly declines.  He is requesting discharge and does not meet criteria for involuntary commitment.   On initial examination, patient observed sitting in his bed awake.  He is cooperative and attentive throughout evaluation.  He does appear anxious.  Reports overall he has been doing well.  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  He endorses some work stress.  Over the past 24 hours he has felt extremely anxious.  Last night he was unable to sleep.  Every time he tried to doze off to sleep he would suddenly wake up feeling distressed.  He also felt as if he was going to pass out.  He called 911 this morning and once they came and assessed him he began to feel calm and felt that he did not need to come to the hospital.  After EMS left he began to feel extremely anxious again and feeling like he  was going to pass out he went to his car and called 911 again and presented to the emergency department for assessment.  Discussed patient's initial presentation to the emergency department and patient denies that he thought people were trying to stab him with needles or trying to get into his crawl space.  He explains that he was feeling extremely anxious and had the pain  as if needles were poking into his or upper torso, he did not mean that people were doing that to him.  He also denies that he thought anyone was in his crawlspace or trying to get in his home.  States that once he got to the hospital he did have an overwhelming feeling of wondering if someone had gotten into his home because he kept waking up and felt uneasy.  He denies any overt paranoia symptoms and does not appear paranoid.  He denies any auditory hallucinations.  He endorses visual hallucinations of seeing bugs when he stares at the lights in the ceiling.  When he looks away from the light he does not see the bugs.  He does not appear to be responding to this.  He does not find this particular really distressing and knows it isn't real and he knowledges it could be from lack of sleep.  He believes that his symptoms have improved since being  in the hospital.  He is feeling more calm.  Patient's UDS is positive for amphetamines and marijuana.  Discussed these findings with patient and he adamantly denies using any substance other than marijuana.  He denies marijuana THCA from the legal/local dispensary.  He buys it in flower form and smokes it daily.  He is unsure how any amphetamines could have gotten into the system.  Patient may not be forthcoming with substance use.  He denies any recent alcohol use.  He denies any current depression.  He denies any anxiety up until the last 24 hours.  Discussed admission to the psychiatric hospital and patient adamantly denies.  He does not feel it is necessary and has been hospitalized in the past and  felt that it was ineffective.  He is also believes that he would lose his job and he would not be able to afford his rent on his apartment as he has no insurance and he would be missing work.  Even though patient reports visual hallucinations of seeing bugs when he looks at the light.  He understands the nature and risk of the symptoms, he does not meet criteria for involuntary commitment, and declines voluntary psychiatric admission after discussion of alternatives.  Capacity to refuse was assessed and documented.  Safe discharge plan provided, outpatient follow-up discussed, and return precautions explained.  Patient agrees to present to [facility] open access walk-in hours for medication management and therapy. He also gives permission to contact his mother who will be able to stay with him for the next few days.  Please see plan below for detailed recommendations.   Diagnoses:  Active Hospital problems: Principal Problem:   Visual hallucinations    Plan   ## Psychiatric Medication Recommendations:  None  ## Medical Decision Making Capacity: Not specifically addressed in this encounter  ## Further Work-up:  -- None  -- most recent EKG on 10/120/2025 had QtC of 509 -- Pertinent labwork reviewed earlier this admission includes: UDS positive for amphetamines and marijuana, CBC, CMP, glucose, BAL<15   ## Disposition:-- There are no psychiatric contraindications to discharge at this time  ## Behavioral / Environmental: -Utilize compassion and acknowledge the patient's experiences while setting clear and realistic expectations for care.    ## Safety and Observation Level:  - Based on my clinical evaluation, I estimate the patient to be at low risk of self harm in the current setting. - At this time, we recommend  routine. This decision is based on my review of the chart including patient's history and current presentation, interview of the patient, mental status examination, and  consideration of suicide risk including evaluating suicidal ideation, plan, intent, suicidal or self-harm behaviors, risk factors, and protective factors. This judgment is based on our ability to directly address suicide risk, implement suicide prevention strategies, and develop a safety plan while the patient is in the clinical setting. Please contact our team if there is a concern that risk level has changed.  CSSR Risk Category:C-SSRS RISK CATEGORY: No Risk  Suicide Risk Assessment: Patient has following modifiable risk factors for suicide: active mental illness (to encompass adhd, tbi, mania, psychosis, trauma reaction), which we are addressing by recommending close outpatient follow-up. Patient has following non-modifiable or demographic risk factors for suicide: male gender, history of suicide attempt, and psychiatric hospitalization Patient has the following protective factors against suicide: Supportive family and Cultural, spiritual, or religious beliefs that discourage suicide  Thank you for this consult request. Recommendations have been communicated to  the primary team.  We will recommend outpatient follow-up at this time.   Elveria VEAR Batter, NP       History of Present Illness  Relevant Aspects of Hospital ED Course:  Admitted on 10/13/2025for feelings of being stabbed in upper extremities repeatedly that began around 1 and visual hallucinations of seeing bugs when he looks at the lights. He carries the psychiatric diagnoses of MDD and a reported history of bipolar and no significant medical history.  Patient Report:  I am feeling much better now.  Frank Dec PA-C, 'Sava LESLEE HAUETER is a 53 y.o. male is brought in by EMS for what he describes as a sensation of being jabbed in the upper extremities repeatedly that began around 1 AM.  On review of his chart, he was discharged from behavioral health in 2019 with active diagnosis of major depressive disorder as well as suicidal  ideation.  No records available showing previous diagnoses for bipolar disorder, however endorses history of same as well as history of schizophrenia.  Denies any HI or SI at this time, does endorse feeling more manic as of late and relates this to increased stress at work.   Psych ROS:  Depression: Denies Anxiety: Endorses Mania (lifetime and current): Endorses a history of Psychosis: (lifetime and current): Endorses a history of auditory hallucinations years ago  Collateral information:  Particia Nose (mother) [phone] -she talks to the patient every day and has noticed no unusual behavior or comments.  She is aware the patient is having difficulty with situations at work.  He does work the evening shift and does not get home until midnight and does have a hard time falling asleep and she believes that has disrupted sleep does affect his mood.  She has no immediate safety concerns with patient being discharged home.  She confirms patient has no weapons/firearms in his home.  She is available to stay with patient over the next few days.  Review of Systems  Constitutional:  Negative for fever.  Respiratory:  Negative for shortness of breath.   Cardiovascular:  Negative for chest pain.  Gastrointestinal:  Negative for nausea and vomiting.  Musculoskeletal: Negative.   Neurological:  Negative for seizures.  Psychiatric/Behavioral:  Positive for substance abuse. The patient is nervous/anxious.      Psychiatric and Social History  Psychiatric History:  Information collected from chart review, patient,  Prev Dx/Sx: MDD, patient reports a history of bipolar mixed episodes Current Psych Provider: None Home Meds (current): None Previous Med Trials: Patient cannot remember Therapy: None  Prior Psych Hospitalization: 2 psychiatric admissions in the past most recent 2-3 years ago in [place]  Prior Self Harm: History of 2 suicide attempts Prior Violence: Denies  Family Psych History:  Unknown Family Hx suicide: Denies  Social History:  Developmental Hx: Denies Educational Hx: Possible graduate Occupational Hx: Works full-time as a Administrator, arts Hx: Denies Living Situation: Lives alone Spiritual Hx: Yes Access to weapons/lethal means: Denies  Substance History Alcohol: Endorses occasional alcohol use History of alcohol withdrawal seizures denies History of DT's denies Tobacco: Denies Illicit drugs: Marijuana use THC a daily Prescription drug abuse: Denies Rehab hx: Denies  Exam Findings  Physical Exam:  Vital Signs:  Temp:  [98.2 F (36.8 C)-98.5 F (36.9 C)] 98.5 F (36.9 C) (10/13 1021) Pulse Rate:  [98-109] 108 (10/13 1021) Resp:  [17-18] 18 (10/13 1021) BP: (128-139)/(87-101) 139/90 (10/13 1021) SpO2:  [100 %] 100 % (10/13 1021) Weight:  [36  kg] 63 kg (10/13 0611) Blood pressure (!) 139/90, pulse (!) 108, temperature 98.5 F (36.9 C), temperature source Oral, resp. rate 18, height 5' 10 (1.778 m), weight 63 kg, SpO2 100%. Body mass index is 19.94 kg/m.  Physical Exam Constitutional:      Appearance: Normal appearance.  Cardiovascular:     Rate and Rhythm: Tachycardia present.  Pulmonary:     Effort: No respiratory distress.  Musculoskeletal:        General: Normal range of motion.  Neurological:     Mental Status: He is alert and oriented to person, place, and time.  Psychiatric:        Attention and Perception: Attention normal.        Mood and Affect: Mood is anxious.        Speech: Speech normal.        Behavior: Behavior normal. Behavior is cooperative.        Thought Content: Thought content normal.        Cognition and Memory: Cognition normal.        Judgment: Judgment normal.     Mental Status Exam: General Appearance: Casual  Orientation:  Full (Time, Place, and Person)  Memory:  Immediate;   Good Recent;   Good Remote;   Good  Concentration:  Concentration: Good and Attention  Span: Good  Recall:  Good  Attention  Good  Eye Contact:  Good  Speech:  Clear and Coherent and Normal Rate  Language:  Good  Volume:  Normal  Mood: Good  Affect:  Congruent  Thought Process:  Coherent  Thought Content:  Logical and Hallucinations: Visual  Suicidal Thoughts:  No  Homicidal Thoughts:  No  Judgement:  Good  Insight:  Good  Psychomotor Activity:  Normal  Akathisia:  No  Fund of Knowledge:  Good      Assets:  Communication Skills Desire for Improvement Financial Resources/Insurance Housing Physical Health Resilience Social Support  Cognition:  WNL  ADL's:  Intact  AIMS (if indicated):        Other History   These have been pulled in through the EMR, reviewed, and updated if appropriate.  Family History:  The patient's family history is not on file.  Medical History: Past Medical History:  Diagnosis Date   Anxiety    Bipolar disorder (HCC)    Depression    GERD (gastroesophageal reflux disease)    Heart murmur    Hives    Migraine    Schizophrenia (HCC)     Surgical History: History reviewed. No pertinent surgical history.   Medications:  No current facility-administered medications for this encounter.  Current Outpatient Medications:    diphenhydrAMINE (BENADRYL) 25 MG tablet, Take 12.5-25 mg by mouth every 6 (six) hours as needed for itching or allergies., Disp: , Rfl:    ibuprofen  (ADVIL ) 200 MG tablet, Take 200-800 mg by mouth every 6 (six) hours as needed for moderate pain (pain score 4-6)., Disp: , Rfl:   Allergies: Allergies  Allergen Reactions   Bee Venom Anaphylaxis   Other Itching    Opioids     Elveria VEAR Batter, NP

## 2024-08-26 NOTE — ED Notes (Signed)
 States he is feeling calmer

## 2024-08-26 NOTE — ED Notes (Signed)
 This RN advised by EMT that pt has been having visual hallucinations, thinks people are breaking into his home via a crawlspace, tonight thought people were stabbing him in the neck with needles, has called 911 2-3 times in past 2-3 mos for similar symptoms, EDP aware

## 2024-08-26 NOTE — ED Provider Triage Note (Signed)
 Emergency Medicine Provider Triage Evaluation Note  Frank Vincent , a 53 y.o. male  was evaluated in triage.  Pt fell asleep quickly tonight which is strange for him as he usually has insomnia. Patient woke up feeling anxious and jittery. EMS noting hypertension and tachycardia intermittently. EMS also with peaked T waves on EKG. EMS also stating that patient has bipolar and feels manic currently - not on any medications for this. Patient denies fever, chest pain, dyspnea, cough, nausea, vomiting, diarrhea, dysuria, hematuria, hematochezia.   Review of Systems  Positive:  Negative:   Physical Exam  There were no vitals taken for this visit. Gen:   Awake, no distress   Resp:  Normal effort  MSK:   Moves extremities without difficulty  Other:    Medical Decision Making  Medically screening exam initiated at 6:07 AM.  Appropriate orders placed.  Frank Vincent was informed that the remainder of the evaluation will be completed by another provider, this initial triage assessment does not replace that evaluation, and the importance of remaining in the ED until their evaluation is complete.     Hoy Nidia FALCON, [place] 08/26/24 [phone]

## 2024-08-26 NOTE — ED Triage Notes (Addendum)
 Pt BIB [facility] for anxiety, pt reports feelind pins and needles and overall worked up with tachycardia, pt reports he drinks a lot of Mt Dew, little water; hx of bi;polar,schizophrenia, anxiety, no on any meds, recent stressors at work and feels as though he has been manic lately, denied HI/SI, v/s en route 138/70, HR 115, RR 19, 98.2, CBG 128; pt given 500ml NS

## 2024-08-26 NOTE — ED Notes (Signed)
 Pt reports he is having visual hallucinations as he is seeing bugs crawling in the lights that he knows are not there, pt further reports he thinks someone was in his home tonight, unsure how many people but possibly 3, pt states he feels as if he has been drugged
